# Patient Record
Sex: Female | Born: 1940
Health system: Southern US, Community
[De-identification: ages and names within clinical notes are randomized; demographics above are authoritative.]

## PROBLEM LIST (undated history)

## (undated) DIAGNOSIS — K219 Gastro-esophageal reflux disease without esophagitis: Secondary | ICD-10-CM

## (undated) DIAGNOSIS — I252 Old myocardial infarction: Secondary | ICD-10-CM

## (undated) DIAGNOSIS — E785 Hyperlipidemia, unspecified: Secondary | ICD-10-CM

## (undated) DIAGNOSIS — I10 Essential (primary) hypertension: Secondary | ICD-10-CM

## (undated) DIAGNOSIS — I2510 Atherosclerotic heart disease of native coronary artery without angina pectoris: Secondary | ICD-10-CM

## (undated) HISTORY — PX: BREAST EXCISIONAL BIOPSY: SUR124

## (undated) HISTORY — PX: ABDOMINAL HYSTERECTOMY: SHX81

---

## 1993-04-07 DIAGNOSIS — I252 Old myocardial infarction: Secondary | ICD-10-CM

## 1993-04-07 HISTORY — DX: Old myocardial infarction: I25.2

## 2003-11-28 ENCOUNTER — Other Ambulatory Visit: Payer: Self-pay

## 2004-01-24 ENCOUNTER — Ambulatory Visit: Payer: Self-pay | Admitting: Ophthalmology

## 2004-03-13 ENCOUNTER — Ambulatory Visit: Payer: Self-pay | Admitting: Ophthalmology

## 2004-04-15 ENCOUNTER — Emergency Department: Payer: Self-pay | Admitting: Emergency Medicine

## 2005-04-19 ENCOUNTER — Other Ambulatory Visit: Payer: Self-pay

## 2005-04-19 ENCOUNTER — Emergency Department: Payer: Self-pay | Admitting: Emergency Medicine

## 2005-07-21 ENCOUNTER — Emergency Department: Payer: Self-pay | Admitting: Emergency Medicine

## 2005-07-24 ENCOUNTER — Ambulatory Visit: Payer: Self-pay | Admitting: Internal Medicine

## 2005-11-09 ENCOUNTER — Emergency Department: Payer: Self-pay | Admitting: Emergency Medicine

## 2005-11-09 ENCOUNTER — Other Ambulatory Visit: Payer: Self-pay

## 2005-12-07 ENCOUNTER — Other Ambulatory Visit: Payer: Self-pay

## 2005-12-07 ENCOUNTER — Emergency Department: Payer: Self-pay | Admitting: Unknown Physician Specialty

## 2006-04-26 ENCOUNTER — Other Ambulatory Visit: Payer: Self-pay

## 2006-04-26 ENCOUNTER — Emergency Department: Payer: Self-pay | Admitting: Unknown Physician Specialty

## 2006-06-16 ENCOUNTER — Emergency Department: Payer: Self-pay | Admitting: Emergency Medicine

## 2006-08-25 ENCOUNTER — Ambulatory Visit: Payer: Self-pay | Admitting: Internal Medicine

## 2006-11-07 ENCOUNTER — Emergency Department: Payer: Self-pay

## 2007-01-14 ENCOUNTER — Ambulatory Visit: Payer: Self-pay | Admitting: Internal Medicine

## 2007-08-27 ENCOUNTER — Ambulatory Visit: Payer: Self-pay | Admitting: Internal Medicine

## 2008-08-29 ENCOUNTER — Ambulatory Visit: Payer: Self-pay | Admitting: Internal Medicine

## 2008-10-24 ENCOUNTER — Ambulatory Visit: Payer: Self-pay | Admitting: Internal Medicine

## 2009-01-06 ENCOUNTER — Emergency Department: Payer: Self-pay | Admitting: Emergency Medicine

## 2009-02-15 ENCOUNTER — Emergency Department: Payer: Self-pay | Admitting: Emergency Medicine

## 2009-04-20 ENCOUNTER — Ambulatory Visit: Payer: Self-pay | Admitting: General Practice

## 2009-09-05 ENCOUNTER — Ambulatory Visit: Payer: Self-pay | Admitting: Internal Medicine

## 2009-10-02 ENCOUNTER — Ambulatory Visit: Payer: Self-pay | Admitting: Internal Medicine

## 2009-10-05 ENCOUNTER — Ambulatory Visit: Payer: Self-pay | Admitting: Internal Medicine

## 2009-10-15 ENCOUNTER — Ambulatory Visit: Payer: Self-pay | Admitting: Internal Medicine

## 2009-11-05 ENCOUNTER — Ambulatory Visit: Payer: Self-pay | Admitting: Internal Medicine

## 2009-12-06 ENCOUNTER — Ambulatory Visit: Payer: Self-pay | Admitting: Internal Medicine

## 2010-01-08 ENCOUNTER — Ambulatory Visit: Payer: Self-pay | Admitting: Gastroenterology

## 2010-01-10 LAB — PATHOLOGY REPORT

## 2010-09-10 ENCOUNTER — Ambulatory Visit: Payer: Self-pay | Admitting: Otolaryngology

## 2011-05-02 ENCOUNTER — Ambulatory Visit: Payer: Self-pay | Admitting: Internal Medicine

## 2011-10-06 ENCOUNTER — Ambulatory Visit: Payer: Self-pay | Admitting: Cardiovascular Disease

## 2011-10-06 LAB — BASIC METABOLIC PANEL
Anion Gap: 5 — ABNORMAL LOW (ref 7–16)
BUN: 13 mg/dL (ref 7–18)
Calcium, Total: 8.8 mg/dL (ref 8.5–10.1)
Chloride: 104 mmol/L (ref 98–107)
EGFR (African American): >60
EGFR (Non-African Amer.): 54 — ABNORMAL LOW
Glucose: 105 mg/dL — ABNORMAL HIGH (ref 65–99)
Osmolality: 276 (ref 275–301)

## 2011-10-06 LAB — HEMOGLOBIN: HGB: 11.9 g/dL — ABNORMAL LOW (ref 12.0–16.0)

## 2011-10-09 ENCOUNTER — Emergency Department: Payer: Self-pay | Admitting: Emergency Medicine

## 2011-10-09 LAB — CBC WITH DIFFERENTIAL/PLATELET
Basophil #: 0 10*3/uL (ref 0.0–0.1)
Eosinophil %: 0.1 %
HCT: 46.5 % (ref 35.0–47.0)
MCH: 31.6 pg (ref 26.0–34.0)
Monocyte #: 0.5 x10 3/mm (ref 0.2–0.9)
Monocyte %: 4.9 %
Neutrophil #: 9.2 10*3/uL — ABNORMAL HIGH (ref 1.4–6.5)
Neutrophil %: 86.2 %
Platelet: 295 10*3/uL (ref 150–440)
RBC: 4.89 10*6/uL (ref 3.80–5.20)
WBC: 10.6 10*3/uL (ref 3.6–11.0)

## 2011-10-09 LAB — COMPREHENSIVE METABOLIC PANEL
Anion Gap: 8 (ref 7–16)
Calcium, Total: 9 mg/dL (ref 8.5–10.1)
Chloride: 101 mmol/L (ref 98–107)
EGFR (African American): >60
Glucose: 132 mg/dL — ABNORMAL HIGH (ref 65–99)
Osmolality: 271 (ref 275–301)
Potassium: 3.9 mmol/L (ref 3.5–5.1)
SGOT(AST): 22 U/L (ref 15–37)
SGPT (ALT): 14 U/L
Sodium: 134 mmol/L — ABNORMAL LOW (ref 136–145)

## 2011-10-09 LAB — URINALYSIS, COMPLETE
Bilirubin,UR: NEGATIVE
Glucose,UR: NEGATIVE mg/dL (ref 0–75)
Leukocyte Esterase: NEGATIVE
Nitrite: NEGATIVE
Protein: 100
RBC,UR: 1 /HPF (ref 0–5)
WBC UR: 4 /HPF (ref 0–5)

## 2011-10-09 LAB — TROPONIN I
Troponin-I: <0.02 ng/mL
Troponin-I: 0.03 ng/mL

## 2011-10-09 LAB — CK TOTAL AND CKMB (NOT AT ARMC): CK, Total: 82 U/L (ref 21–215)

## 2011-10-14 DIAGNOSIS — E785 Hyperlipidemia, unspecified: Secondary | ICD-10-CM | POA: Insufficient documentation

## 2011-10-14 DIAGNOSIS — I219 Acute myocardial infarction, unspecified: Secondary | ICD-10-CM | POA: Insufficient documentation

## 2011-10-14 DIAGNOSIS — I252 Old myocardial infarction: Secondary | ICD-10-CM | POA: Insufficient documentation

## 2011-10-14 DIAGNOSIS — E782 Mixed hyperlipidemia: Secondary | ICD-10-CM | POA: Insufficient documentation

## 2011-10-14 HISTORY — DX: Acute myocardial infarction, unspecified: I21.9

## 2011-10-15 HISTORY — PX: CORONARY ARTERY BYPASS GRAFT: SHX141

## 2011-11-13 ENCOUNTER — Encounter: Payer: Self-pay | Admitting: Internal Medicine

## 2011-12-07 ENCOUNTER — Encounter: Payer: Self-pay | Admitting: Internal Medicine

## 2012-01-06 ENCOUNTER — Encounter: Payer: Self-pay | Admitting: Internal Medicine

## 2014-04-14 ENCOUNTER — Ambulatory Visit: Payer: Self-pay | Admitting: Internal Medicine

## 2014-04-20 ENCOUNTER — Ambulatory Visit: Payer: Self-pay | Admitting: Internal Medicine

## 2015-12-16 ENCOUNTER — Encounter: Payer: Self-pay | Admitting: Emergency Medicine

## 2015-12-16 ENCOUNTER — Observation Stay
Admission: EM | Admit: 2015-12-16 | Discharge: 2015-12-17 | Disposition: A | Discharge status: Home / Self Care | Payer: Medicare Other | Attending: Internal Medicine | Admitting: Internal Medicine

## 2015-12-16 DIAGNOSIS — E785 Hyperlipidemia, unspecified: Secondary | ICD-10-CM | POA: Diagnosis not present

## 2015-12-16 DIAGNOSIS — I493 Ventricular premature depolarization: Secondary | ICD-10-CM | POA: Insufficient documentation

## 2015-12-16 DIAGNOSIS — N179 Acute kidney failure, unspecified: Secondary | ICD-10-CM | POA: Diagnosis present

## 2015-12-16 DIAGNOSIS — Z8249 Family history of ischemic heart disease and other diseases of the circulatory system: Secondary | ICD-10-CM | POA: Insufficient documentation

## 2015-12-16 DIAGNOSIS — K219 Gastro-esophageal reflux disease without esophagitis: Secondary | ICD-10-CM | POA: Diagnosis present

## 2015-12-16 DIAGNOSIS — F329 Major depressive disorder, single episode, unspecified: Secondary | ICD-10-CM | POA: Insufficient documentation

## 2015-12-16 DIAGNOSIS — I2510 Atherosclerotic heart disease of native coronary artery without angina pectoris: Secondary | ICD-10-CM | POA: Diagnosis present

## 2015-12-16 DIAGNOSIS — K449 Diaphragmatic hernia without obstruction or gangrene: Secondary | ICD-10-CM | POA: Diagnosis not present

## 2015-12-16 DIAGNOSIS — R112 Nausea with vomiting, unspecified: Secondary | ICD-10-CM | POA: Diagnosis present

## 2015-12-16 DIAGNOSIS — Z8042 Family history of malignant neoplasm of prostate: Secondary | ICD-10-CM | POA: Insufficient documentation

## 2015-12-16 DIAGNOSIS — I10 Essential (primary) hypertension: Secondary | ICD-10-CM | POA: Diagnosis not present

## 2015-12-16 DIAGNOSIS — Z90710 Acquired absence of both cervix and uterus: Secondary | ICD-10-CM | POA: Diagnosis not present

## 2015-12-16 DIAGNOSIS — E860 Dehydration: Principal | ICD-10-CM

## 2015-12-16 DIAGNOSIS — K529 Noninfective gastroenteritis and colitis, unspecified: Secondary | ICD-10-CM | POA: Insufficient documentation

## 2015-12-16 DIAGNOSIS — R531 Weakness: Secondary | ICD-10-CM

## 2015-12-16 HISTORY — DX: Hyperlipidemia, unspecified: E78.5

## 2015-12-16 HISTORY — DX: Atherosclerotic heart disease of native coronary artery without angina pectoris: I25.10

## 2015-12-16 HISTORY — DX: Essential (primary) hypertension: I10

## 2015-12-16 HISTORY — DX: Gastro-esophageal reflux disease without esophagitis: K21.9

## 2015-12-16 LAB — CBC
HCT: 45.1 % (ref 35.0–47.0)
Hemoglobin: 16 g/dL (ref 12.0–16.0)
MCH: 32.7 pg (ref 26.0–34.0)
MCHC: 35.4 g/dL (ref 32.0–36.0)
MCV: 92.4 fL (ref 80.0–100.0)
Platelets: 301 10*3/uL (ref 150–440)
RBC: 4.88 MIL/uL (ref 3.80–5.20)
RDW: 13.4 % (ref 11.5–14.5)
WBC: 12.5 10*3/uL — ABNORMAL HIGH (ref 3.6–11.0)

## 2015-12-16 LAB — URINALYSIS COMPLETE WITH MICROSCOPIC (ARMC ONLY)
BILIRUBIN URINE: NEGATIVE
Glucose, UA: NEGATIVE mg/dL
Hgb urine dipstick: NEGATIVE
Ketones, ur: NEGATIVE mg/dL
Leukocytes, UA: NEGATIVE
NITRITE: NEGATIVE
Protein, ur: 100 mg/dL — AB
Specific Gravity, Urine: 1.019 (ref 1.005–1.030)
pH: 5 (ref 5.0–8.0)

## 2015-12-16 LAB — COMPREHENSIVE METABOLIC PANEL
ALK PHOS: 75 U/L (ref 38–126)
ALT: 11 U/L — AB (ref 14–54)
ANION GAP: 13 (ref 5–15)
AST: 27 U/L (ref 15–41)
Albumin: 3.8 g/dL (ref 3.5–5.0)
BILIRUBIN TOTAL: 0.4 mg/dL (ref 0.3–1.2)
BUN: 25 mg/dL — ABNORMAL HIGH (ref 6–20)
CALCIUM: 9 mg/dL (ref 8.9–10.3)
CO2: 22 mmol/L (ref 22–32)
CREATININE: 2.48 mg/dL — AB (ref 0.44–1.00)
Chloride: 99 mmol/L — ABNORMAL LOW (ref 101–111)
GFR calc non Af Amer: 18 mL/min — ABNORMAL LOW (ref >60)
GFR, EST AFRICAN AMERICAN: 21 mL/min — AB (ref >60)
Glucose, Bld: 164 mg/dL — ABNORMAL HIGH (ref 65–99)
Potassium: 3.7 mmol/L (ref 3.5–5.1)
SODIUM: 134 mmol/L — AB (ref 135–145)
TOTAL PROTEIN: 8.6 g/dL — AB (ref 6.5–8.1)

## 2015-12-16 LAB — TROPONIN I: Troponin I: 0.03 ng/mL (ref <0.03)

## 2015-12-16 LAB — LIPASE, BLOOD: Lipase: 30 U/L (ref 11–51)

## 2015-12-16 MED ORDER — ONDANSETRON HCL 4 MG PO TABS: 4.0000 mg | ORAL_TABLET | Freq: Four times a day (QID) | ORAL | Status: DC | PRN

## 2015-12-16 MED ORDER — SODIUM CHLORIDE 0.9 % IV SOLN
INTRAVENOUS | Status: AC
  Administered 2015-12-16 – 2015-12-17 (×2): via INTRAVENOUS

## 2015-12-16 MED ORDER — PANTOPRAZOLE SODIUM 40 MG PO TBEC
40.0000 mg | DELAYED_RELEASE_TABLET | Freq: Every day | ORAL | Status: DC
  Administered 2015-12-17: 40 mg via ORAL
  Filled 2015-12-16: qty 1

## 2015-12-16 MED ORDER — SODIUM CHLORIDE 0.9 % IV BOLUS (SEPSIS)
1000.0000 mL | Freq: Once | INTRAVENOUS | Status: AC
Start: 2015-12-16 — End: 2015-12-16
  Administered 2015-12-16: 1000 mL via INTRAVENOUS

## 2015-12-16 MED ORDER — ACETAMINOPHEN 325 MG PO TABS
650.0000 mg | ORAL_TABLET | Freq: Four times a day (QID) | ORAL | Status: DC | PRN
Start: 2015-12-16 — End: 2015-12-17

## 2015-12-16 MED ORDER — ALPRAZOLAM 0.25 MG PO TABS
0.2500 mg | ORAL_TABLET | Freq: Every day | ORAL | Status: DC
  Administered 2015-12-16: 0.25 mg via ORAL
  Filled 2015-12-16: qty 1

## 2015-12-16 MED ORDER — PRAVASTATIN SODIUM 20 MG PO TABS: 20.0000 mg | ORAL_TABLET | Freq: Every evening | ORAL | Status: DC

## 2015-12-16 MED ORDER — HEPARIN SODIUM (PORCINE) 5000 UNIT/ML IJ SOLN
5000.0000 [IU] | Freq: Three times a day (TID) | INTRAMUSCULAR | Status: DC
  Administered 2015-12-17 (×2): 5000 [IU] via SUBCUTANEOUS
  Filled 2015-12-16 (×2): qty 1

## 2015-12-16 MED ORDER — ONDANSETRON HCL 4 MG/2ML IJ SOLN: 4.0000 mg | Freq: Four times a day (QID) | INTRAMUSCULAR | Status: DC | PRN

## 2015-12-16 MED ORDER — SODIUM CHLORIDE 0.9% FLUSH: 3.0000 mL | Freq: Two times a day (BID) | INTRAVENOUS | Status: DC

## 2015-12-16 MED ORDER — ACETAMINOPHEN 650 MG RE SUPP
650.0000 mg | Freq: Four times a day (QID) | RECTAL | Status: DC | PRN
Start: 2015-12-16 — End: 2015-12-17

## 2015-12-16 MED ORDER — SODIUM CHLORIDE 0.9 % IV BOLUS (SEPSIS)
1000.0000 mL | Freq: Once | INTRAVENOUS | Status: AC
  Administered 2015-12-16: 1000 mL via INTRAVENOUS

## 2015-12-16 NOTE — ED Provider Notes (Signed)
[facility] Emergency Department Provider Note  Time seen: 7:54 PM  I have reviewed the triage vital signs and the nursing notes.   HISTORY  Chief Complaint Emesis    HPI Natasha Murray is a 75 y.o. female with a past medical history of hypertension who presents the emergency department for nausea and vomiting and diarrhea. According to the patient around 11:00 last night she began feeling very nauseated, has had many rounds of diarrhea which she states is approximately 10 episodes total. Has had multiple episodes of vomiting. Denies any abdominal pain, just states she is a little sore from all the vomiting. Denies any chest pain or shortness of breath. Denies any fever or dysuria. Patient states she continues to feel nauseated, and recently had a loose bowel movement. Denies any black or bloody stool or vomit.  Past Medical History:  Diagnosis Date  . Hypertension     There are no active problems to display for this patient.   Past Surgical History:  Procedure Laterality Date  . ABDOMINAL HYSTERECTOMY    . CARDIAC SURGERY      Prior to Admission medications   Not on File    No Known Allergies  History reviewed. No pertinent family history.  Social History Social History  Substance Use Topics  . Smoking status: Never Smoker  . Smokeless tobacco: Never Used  . Alcohol use No    Review of Systems Constitutional: Negative for fever. Cardiovascular: Negative for chest pain. Respiratory: Negative for shortness of breath. Gastrointestinal: Negative for abdominal pain. Positive for nausea, vomiting, diarrhea. Musculoskeletal: Negative for back pain. Neurological: Negative for headache 10-point ROS otherwise negative.  ____________________________________________   PHYSICAL EXAM:  VITAL SIGNS: ED Triage Vitals  Enc Vitals Group     BP 12/16/15 1758 (!) 87/52     Pulse Rate 12/16/15 1758 100     Resp 12/16/15 1758 20     Temp 12/16/15  1756 98.2 F (36.8 C)     Temp Source 12/16/15 1756 Oral     SpO2 12/16/15 1758 99 %     Weight 12/16/15 1753 159 lb (72.1 kg)     Height 12/16/15 1753 5\' 4"  (1.626 m)     Head Circumference --      Peak Flow --      Pain Score 12/16/15 1753 8     Pain Loc --      Pain Edu? --      Excl. in [place]? --     Constitutional: Alert and oriented. Well appearing and in no distress. Eyes: Normal exam ENT   Head: Normocephalic and atraumatic   Mouth/Throat: Mucous membranes are moist. Cardiovascular: Normal rate, regular rhythm. No murmur Respiratory: Normal respiratory effort without tachypnea nor retractions. Breath sounds are clear  Gastrointestinal: Soft and nontender. No distention.   Musculoskeletal: Nontender with normal range of motion in all extremities. Neurologic:  Normal speech and language. No gross focal neurologic deficits Skin:  Skin is warm, dry and intact.  Psychiatric: Mood and affect are normal.   ____________________________________________    EKG  EKG reviewed and interpreted by myself shows normal sinus rhythm at 95 bpm, narrow QRS, normal axis patient does have inferolateral ST changes. However largely unchanged 10/09/11.  ____________________________________________    INITIAL IMPRESSION / ASSESSMENT AND PLAN / ED COURSE  Pertinent labs & imaging results that were available during my care of the patient were reviewed by me and considered in my medical decision making (see chart  for details).  Patient presents the emergency department nausea, vomiting, diarrhea beginning at 11:00 last night. Patient's blood pressure is in the upper [id number] systolic. Patient labs are resulted showing an elevated creatinine consistent with dehydration. We'll continue with IV fluids. Patient does have EKG abnormalities, other largely unchanged from prior EKG in 2013. Patient's troponin is 0.03.  We will send a stool culture/file fire along with C. difficile testing. Patient denies  any recent antibiotics. We'll place on enteric precautions, continue with IV fluids and plan to admit to the hospital for further treatment. As the patient has a nontender abdominal exam do not believe CT imaging would be of much utility at this point.  ____________________________________________   FINAL CLINICAL IMPRESSION(S) / ED DIAGNOSES  Acute renal insufficiency Dehydration Hypotension    Harvest Dark, MD 12/16/15 2218

## 2015-12-16 NOTE — ED Triage Notes (Signed)
Pt reports NVD since last night. Denies blood in stool or vomit. C/o LLQ and lower mid abdominal pain. Denies urinary sx. Subjective fever

## 2015-12-16 NOTE — H&P (Signed)
[place] [facility] [place] NAME: Natasha Murray    MR#:  [id number]  DATE OF BIRTH:  1941/02/01  DATE OF ADMISSION:  12/16/2015  PRIMARY CARE PHYSICIAN: Pcp Not In System   REQUESTING/REFERRING PHYSICIAN: Kerman Passey, MD  CHIEF COMPLAINT:   Chief Complaint  Patient presents with  . Emesis    HISTORY OF PRESENT ILLNESS:  Natasha Murray  is a 75 y.o. female who presents with 24 hours of intractable nausea and vomiting with diarrhea. Patient states that she's not had any recent sick contacts, she denies any fevers or chills, denies any recent antibiotic use. States she's never had C. difficile infection in the past. States that she has had episodes similar to this one, but the last one was years ago. At that time she was told that she had a hiatal hernia and that vomiting episodes were related to that. She states that she has not had any episodes like this since her CABG procedure 6-7 years ago. She states that initially she had some abdominal pain followed by the diarrhea, and subsequent to that she had profuse vomiting about every hour, accompanied by mild diarrhea each time she vomited. In the ED she was found to be dehydrated, initially hypotensive on presentation, and had significant AK I. Hospitalists were called for admission  PAST MEDICAL HISTORY:   Past Medical History:  Diagnosis Date  . CAD (coronary artery disease)   . GERD (gastroesophageal reflux disease)   . HLD (hyperlipidemia)   . Hypertension     PAST SURGICAL HISTORY:   Past Surgical History:  Procedure Laterality Date  . ABDOMINAL HYSTERECTOMY    . CARDIAC SURGERY      SOCIAL HISTORY:   Social History  Substance Use Topics  . Smoking status: Never Smoker  . Smokeless tobacco: Never Used  . Alcohol use No    FAMILY HISTORY:   Family History  Problem Relation Age of Onset  . Peripheral vascular disease Father   . Prostate cancer Brother   . CAD Mother    . Hypertension Mother     DRUG ALLERGIES:  No Known Allergies  MEDICATIONS AT HOME:   Prior to Admission medications   Not on File    REVIEW OF SYSTEMS:  Review of Systems  Constitutional: Negative for chills, fever, malaise/fatigue and weight loss.  HENT: Negative for ear pain, hearing loss and tinnitus.   Eyes: Negative for blurred vision, double vision, pain and redness.  Respiratory: Negative for cough, hemoptysis and shortness of breath.   Cardiovascular: Negative for chest pain, palpitations, orthopnea and leg swelling.  Gastrointestinal: Positive for abdominal pain, diarrhea, nausea and vomiting. Negative for constipation.  Genitourinary: Negative for dysuria, frequency and hematuria.  Musculoskeletal: Negative for back pain, joint pain and neck pain.  Skin:       No acne, rash, or lesions  Neurological: Positive for weakness. Negative for dizziness, tremors and focal weakness.  Endo/Heme/Allergies: Negative for polydipsia. Does not bruise/bleed easily.  Psychiatric/Behavioral: Negative for depression. The patient is not nervous/anxious and does not have insomnia.      VITAL SIGNS:   Vitals:   12/16/15 1753 12/16/15 1756 12/16/15 1758 12/16/15 1759  BP:   (!) 87/52 (!) 84/55  Pulse:   100   Resp:   20   Temp:  98.2 F (36.8 C) 98.2 F (36.8 C)   TempSrc:  Oral Oral   SpO2:   99%   Weight: 72.1 kg (159 lb)  Height: 5\' 4"  (1.626 m)      Wt Readings from Last 3 Encounters:  12/16/15 72.1 kg (159 lb)    PHYSICAL EXAMINATION:  Physical Exam  Vitals reviewed. Constitutional: She is oriented to person, place, and time. She appears well-developed and well-nourished. No distress.  HENT:  Head: Normocephalic and atraumatic.  Dry mucous membranes  Eyes: Conjunctivae and EOM are normal. Pupils are equal, round, and reactive to light. No scleral icterus.  Neck: Normal range of motion. Neck supple. No JVD present. No thyromegaly present.  Cardiovascular: Normal  rate, regular rhythm and intact distal pulses.  Exam reveals no gallop and no friction rub.   No murmur heard. Respiratory: Effort normal and breath sounds normal. No respiratory distress. She has no wheezes. She has no rales.  GI: Soft. Bowel sounds are normal. She exhibits no distension. There is tenderness (diffuse, worst in the periumbilical to suprapubic region).  Musculoskeletal: Normal range of motion. She exhibits no edema.  No arthritis, no gout  Lymphadenopathy:    She has no cervical adenopathy.  Neurological: She is alert and oriented to person, place, and time. No cranial nerve deficit.  No dysarthria, no aphasia  Skin: Skin is warm and dry. No rash noted. No erythema.  Psychiatric: She has a normal mood and affect. Her behavior is normal. Judgment and thought content normal.    LABORATORY PANEL:   CBC  Recent Labs Lab 12/16/15 1756  WBC 12.5*  HGB 16.0  HCT 45.1  PLT 301   ------------------------------------------------------------------------------------------------------------------  Chemistries   Recent Labs Lab 12/16/15 1756  NA 134*  K 3.7  CL 99*  CO2 22  GLUCOSE 164*  BUN 25*  CREATININE 2.48*  CALCIUM 9.0  AST 27  ALT 11*  ALKPHOS 75  BILITOT 0.4   ------------------------------------------------------------------------------------------------------------------  Cardiac Enzymes  Recent Labs Lab 12/16/15 1756  TROPONINI 0.03*   ------------------------------------------------------------------------------------------------------------------  RADIOLOGY:  No results found.  EKG:   Orders placed or performed during the hospital encounter of 12/16/15  . ED EKG within 10 minutes  . ED EKG within 10 minutes  . EKG 12-Lead  . EKG 12-Lead    IMPRESSION AND PLAN:  Principal Problem:   AKI (acute kidney injury) ([place]) - given her profuse diarrhea and vomiting this is almost certainly old prerenal insult. We will hydrate her with IV  fluids tonight and monitor for expected improvement in her creatinine. Avoid nephrotoxins. Active Problems:   Intractable nausea and vomiting - unclear etiology at this time. GI panel and C. difficile sent from the ED, we'll follow up on these labs. Patient's vomiting is currently controlled with medications given in the ED, we will keep these when necessary antiemetics on board.   HTN (hypertension) - hold any home antihypertensives at this time given that her blood pressure was initially hypotensive on presentation the ED.   CAD (coronary artery disease) - continue home meds   Weakness - generalized and nonfocal, due to her dehydration and significant nausea vomiting diarrhea, expect improvement with hydration and treatment as above   GERD (gastroesophageal reflux disease) - continue home meds  All the records are reviewed and case discussed with ED provider. Management plans discussed with the patient and/or family.  DVT PROPHYLAXIS: SubQ heparin  GI PROPHYLAXIS: PPI, H2 blocker, None  ADMISSION STATUS: Observation  CODE STATUS: Full Code Status History    This patient does not have a recorded code status. Please follow your organizational policy for patients in this situation.  TOTAL TIME TAKING CARE OF THIS PATIENT: 40 minutes.    ,  [place] 12/16/2015, 9:32 PM  [facility]  Office  [phone]  CC: Primary care physician; Pcp Not In System

## 2015-12-16 NOTE — Care Management Obs Status (Signed)
[facility] NOTIFICATION   Patient Details  Name: Natasha Murray MRN: [id number] Date of Birth: 28-Mar-1941   Medicare Observation Status Notification Given:  Yes    CrutchfieldAntony Haste, RN 12/16/2015, 9:47 PM

## 2015-12-16 NOTE — ED Notes (Signed)
Notified MD of critical troponin.

## 2015-12-17 LAB — GASTROINTESTINAL PANEL BY PCR, STOOL (REPLACES STOOL CULTURE)

## 2015-12-17 LAB — CBC
HCT: 35.7 % (ref 35.0–47.0)
Hemoglobin: 12.8 g/dL (ref 12.0–16.0)
MCH: 33.4 pg (ref 26.0–34.0)
MCHC: 36 g/dL (ref 32.0–36.0)
MCV: 92.7 fL (ref 80.0–100.0)
PLATELETS: 222 10*3/uL (ref 150–440)
RBC: 3.85 MIL/uL (ref 3.80–5.20)
RDW: 13.7 % (ref 11.5–14.5)
WBC: 8.8 10*3/uL (ref 3.6–11.0)

## 2015-12-17 LAB — C DIFFICILE QUICK SCREEN W PCR REFLEX
C DIFFICILE (CDIFF) INTERP: NOT DETECTED
C DIFFICLE (CDIFF) ANTIGEN: NEGATIVE
C Diff toxin: NEGATIVE

## 2015-12-17 LAB — BASIC METABOLIC PANEL
ANION GAP: 7 (ref 5–15)
BUN: 25 mg/dL — ABNORMAL HIGH (ref 6–20)
CALCIUM: 7.9 mg/dL — AB (ref 8.9–10.3)
CHLORIDE: 107 mmol/L (ref 101–111)
CO2: 24 mmol/L (ref 22–32)
CREATININE: 1.5 mg/dL — AB (ref 0.44–1.00)
GFR calc non Af Amer: 33 mL/min — ABNORMAL LOW (ref >60)
GFR, EST AFRICAN AMERICAN: 38 mL/min — AB (ref >60)
Glucose, Bld: 108 mg/dL — ABNORMAL HIGH (ref 65–99)
Potassium: 3.4 mmol/L — ABNORMAL LOW (ref 3.5–5.1)
SODIUM: 138 mmol/L (ref 135–145)

## 2015-12-17 LAB — TROPONIN I
Troponin I: 0.03 ng/mL (ref <0.03)
Troponin I: <0.03 ng/mL (ref <0.03)

## 2015-12-17 MED ORDER — POTASSIUM CHLORIDE CRYS ER 20 MEQ PO TBCR
40.0000 meq | EXTENDED_RELEASE_TABLET | Freq: Once | ORAL | Status: AC
  Administered 2015-12-17: 40 meq via ORAL
  Filled 2015-12-17: qty 2

## 2015-12-17 NOTE — Progress Notes (Signed)
12/17/2015 4:39 PM  Cristen A Rasberry to be D/C'd Home per MD order.  Discussed prescriptions and follow up appointments with the patient. Prescriptions given to patient, medication list explained in detail. Pt verbalized understanding.    Medication List    STOP taking these medications   hydrochlorothiazide 12.5 MG capsule Commonly known as:  MICROZIDE     TAKE these medications   ALPRAZolam 0.25 MG tablet Commonly known as:  XANAX Take 0.25 mg by mouth at bedtime.   amLODipine-benazepril 10-40 MG capsule Commonly known as:  LOTREL Take 1 capsule by mouth daily.   pravastatin 20 MG tablet Commonly known as:  PRAVACHOL Take 20 mg by mouth every evening.   RABEprazole 20 MG tablet Commonly known as:  ACIPHEX Take 20 mg by mouth daily.       Vitals:   12/17/15 0910 12/17/15 1414  BP: 140/60 136/69  Pulse: 80 81  Resp: 20 16  Temp: 98.7 F (37.1 C) 98 F (36.7 C)    Skin clean, dry and intact without evidence of skin break down, no evidence of skin tears noted. IV catheter discontinued intact. Site without signs and symptoms of complications. Dressing and pressure applied. Pt denies pain at this time. No complaints noted.  An After Visit Summary was printed and given to the patient. Patient escorted via [place], and D/C home via private auto.  Dola Argyle

## 2015-12-17 NOTE — Discharge Instructions (Signed)

## 2015-12-17 NOTE — Discharge Summary (Signed)
Natasha Murray, 75 y.o., DOB 1940/11/12, MRN [id number]. Admission date: 12/16/2015 Discharge Date 12/17/2015 Primary MD Pcp Not In System Admitting Physician Lance Coon, MD  Admission Diagnosis  Dehydration [E86.0] Non-intractable vomiting with nausea, vomiting of unspecified type [R11.2]  Discharge Diagnosis   Principal Problem:   AKI (acute kidney injury) ([place]) due to tree dehydration   Intractable nausea and vomiting due to acute gastroenteritis   Weakness   HTN (hypertension)   GERD (gastroesophageal reflux disease)   CAD (coronary artery disease)         Natasha Murray  is a 75 y.o. female who presents with 24 hours of intractable nausea and vomiting with diarrhea. Patient was noted to have elevated creatinine with acute renal failure due to dehydration. She was admitted for further evaluation. Stool studies were negative for C. difficile . Stool PCR was negative as well. Patient's symptoms have now resolved is doing much better her creatinine is improved. At this point I recommended patient continue oral hydration at home. We'll stop her HCTZ until seen by primary care provider she'll need a follow-up BMP at that time. But she is doing much better if she tolerates her lunch she'll be discharged later today.           Consults  None  Significant Tests:  See full reports for all details    No results found.     Today   Subjective:   Natasha Murray patient feeling much better diarrhea is resolved nausea is resolved she wants to eat  Objective:   Blood pressure 136/69, pulse 81, temperature 98 F (36.7 C), temperature source Oral, resp. rate 16, height 4\' 10"  (1.473 m), weight 162 lb 8 oz (73.7 kg), SpO2 99 %.  .  Intake/Output Summary (Last 24 hours) at 12/17/15 1420 Last data filed at 12/17/15 1300  Gross per 24 hour  Intake              120 ml  Output              250 ml  Net             -130 ml    Exam VITAL SIGNS: Blood pressure  136/69, pulse 81, temperature 98 F (36.7 C), temperature source Oral, resp. rate 16, height 4\' 10"  (1.473 m), weight 162 lb 8 oz (73.7 kg), SpO2 99 %.  GENERAL:  74 y.o.-year-old patient lying in the bed with no acute distress.  EYES: Pupils equal, round, reactive to light and accommodation. No scleral icterus. Extraocular muscles intact.  HEENT: Head atraumatic, normocephalic. Oropharynx and nasopharynx clear.  NECK:  Supple, no jugular venous distention. No thyroid enlargement, no tenderness.  LUNGS: Normal breath sounds bilaterally, no wheezing, rales,rhonchi or crepitation. No use of accessory muscles of respiration.  CARDIOVASCULAR: S1, S2 normal. No murmurs, rubs, or gallops.  ABDOMEN: Soft, nontender, nondistended. Bowel sounds present. No organomegaly or mass.  EXTREMITIES: No pedal edema, cyanosis, or clubbing.  NEUROLOGIC: Cranial nerves II through XII are intact. Muscle strength 5/5 in all extremities. Sensation intact. Gait not checked.  PSYCHIATRIC: The patient is alert and oriented x 3.  SKIN: No obvious rash, lesion, or ulcer.   Data Review     CBC w Diff: Lab Results  Component Value Date   WBC 8.8 12/17/2015   HGB 12.8 12/17/2015   HGB 15.5 10/09/2011   HCT 35.7 12/17/2015   HCT 46.5 10/09/2011   PLT 222 12/17/2015   PLT 295 10/09/2011  LYMPHOPCT 8.4 10/09/2011   MONOPCT 4.9 10/09/2011   EOSPCT 0.1 10/09/2011   BASOPCT 0.4 10/09/2011   CMP: Lab Results  Component Value Date   NA 138 12/17/2015   NA 134 (L) 10/09/2011   K 3.4 (L) 12/17/2015   K 3.9 10/09/2011   CL 107 12/17/2015   CL 101 10/09/2011   CO2 24 12/17/2015   CO2 25 10/09/2011   BUN 25 (H) 12/17/2015   BUN 14 10/09/2011   CREATININE 1.50 (H) 12/17/2015   CREATININE 1.05 10/09/2011   PROT 8.6 (H) 12/16/2015   PROT 9.0 (H) 10/09/2011   ALBUMIN 3.8 12/16/2015   ALBUMIN 3.6 10/09/2011   BILITOT 0.4 12/16/2015   BILITOT 0.4 10/09/2011   ALKPHOS 75 12/16/2015   ALKPHOS 104 10/09/2011   AST  27 12/16/2015   AST 22 10/09/2011   ALT 11 (L) 12/16/2015   ALT 14 10/09/2011  .  Micro Results Recent Results (from the past 240 hour(s))  C difficile quick scan w PCR reflex     Status: None   Collection Time: 12/16/15 11:15 PM  Result Value Ref Range Status   C Diff antigen NEGATIVE NEGATIVE Final   C Diff toxin NEGATIVE NEGATIVE Final   C Diff interpretation No C. difficile detected.  Final  Gastrointestinal Panel by PCR , Stool     Status: None   Collection Time: 12/16/15 11:15 PM  Result Value Ref Range Status   Campylobacter species NOT DETECTED NOT DETECTED Final   Plesimonas shigelloides NOT DETECTED NOT DETECTED Final   Salmonella species NOT DETECTED NOT DETECTED Final   Yersinia enterocolitica NOT DETECTED NOT DETECTED Final   Vibrio species NOT DETECTED NOT DETECTED Final   Vibrio cholerae NOT DETECTED NOT DETECTED Final   Enteroaggregative E coli (EAEC) NOT DETECTED NOT DETECTED Final   Enteropathogenic E coli (EPEC) NOT DETECTED NOT DETECTED Final   Enterotoxigenic E coli (ETEC) NOT DETECTED NOT DETECTED Final   Shiga like toxin producing E coli (STEC) NOT DETECTED NOT DETECTED Final   E. coli O157 NOT DETECTED NOT DETECTED Final   Shigella/Enteroinvasive E coli (EIEC) NOT DETECTED NOT DETECTED Final   Cryptosporidium NOT DETECTED NOT DETECTED Final   Cyclospora cayetanensis NOT DETECTED NOT DETECTED Final   Entamoeba histolytica NOT DETECTED NOT DETECTED Final   Giardia lamblia NOT DETECTED NOT DETECTED Final   Adenovirus F40/41 NOT DETECTED NOT DETECTED Final   Astrovirus NOT DETECTED NOT DETECTED Final   Norovirus GI/GII NOT DETECTED NOT DETECTED Final   Rotavirus A NOT DETECTED NOT DETECTED Final   Sapovirus (I, II, IV, and V) NOT DETECTED NOT DETECTED Final        Code Status Orders        Start     Ordered   12/16/15 2309  Full code  Continuous     12/16/15 2308    Code Status History    Date Active Date Inactive Code Status Order ID Comments  User Context   This patient has a current code status but no historical code status.          Follow-up Information    MASOUD,JAVED, MD On 12/24/2015.   Specialty:  Internal Medicine Why:  Monday at 10:00am for hospital follow-up Contact information: [place] [place] [address] [phone]           Discharge Medications     Medication List    STOP taking these medications   hydrochlorothiazide 12.5 MG capsule Commonly known as:  MICROZIDE     TAKE these medications   ALPRAZolam 0.25 MG tablet Commonly known as:  XANAX Take 0.25 mg by mouth at bedtime.   amLODipine-benazepril 10-40 MG capsule Commonly known as:  LOTREL Take 1 capsule by mouth daily.   pravastatin 20 MG tablet Commonly known as:  PRAVACHOL Take 20 mg by mouth every evening.   RABEprazole 20 MG tablet Commonly known as:  ACIPHEX Take 20 mg by mouth daily.          Total Time in preparing paper work, data evaluation and todays exam - 35 minutes  Dustin Flock M.D on 12/17/2015 at 2:20 PM  [facility]   Office  [phone]

## 2015-12-17 NOTE — Care Management (Signed)
Patient admitted from home with AKI as a result of diarrhea and vomiting.  Patient lives at home alone.  Nursing reports that patient is mobile with standby assist.  RNCM available for any discharge needs.

## 2016-01-06 ENCOUNTER — Emergency Department
Admission: EM | Admit: 2016-01-06 | Discharge: 2016-01-06 | Disposition: A | Discharge status: Home / Self Care | Payer: Medicare Other | Attending: Student in an Organized Health Care Education/Training Program | Admitting: Student in an Organized Health Care Education/Training Program

## 2016-01-06 ENCOUNTER — Emergency Department: Payer: Medicare Other

## 2016-01-06 ENCOUNTER — Encounter: Payer: Self-pay | Admitting: Emergency Medicine

## 2016-01-06 DIAGNOSIS — R1013 Epigastric pain: Secondary | ICD-10-CM | POA: Diagnosis not present

## 2016-01-06 DIAGNOSIS — I2510 Atherosclerotic heart disease of native coronary artery without angina pectoris: Secondary | ICD-10-CM | POA: Diagnosis not present

## 2016-01-06 DIAGNOSIS — R112 Nausea with vomiting, unspecified: Secondary | ICD-10-CM | POA: Insufficient documentation

## 2016-01-06 DIAGNOSIS — I10 Essential (primary) hypertension: Secondary | ICD-10-CM | POA: Insufficient documentation

## 2016-01-06 DIAGNOSIS — I252 Old myocardial infarction: Secondary | ICD-10-CM | POA: Diagnosis not present

## 2016-01-06 HISTORY — DX: Old myocardial infarction: I25.2

## 2016-01-06 LAB — COMPREHENSIVE METABOLIC PANEL
ALBUMIN: 3.8 g/dL (ref 3.5–5.0)
ALK PHOS: 84 U/L (ref 38–126)
ALT: 12 U/L — ABNORMAL LOW (ref 14–54)
ANION GAP: 7 (ref 5–15)
AST: 21 U/L (ref 15–41)
BUN: 14 mg/dL (ref 6–20)
CHLORIDE: 101 mmol/L (ref 101–111)
CO2: 27 mmol/L (ref 22–32)
Calcium: 9 mg/dL (ref 8.9–10.3)
Creatinine, Ser: 1.01 mg/dL — ABNORMAL HIGH (ref 0.44–1.00)
GFR calc non Af Amer: 53 mL/min — ABNORMAL LOW (ref >60)
GLUCOSE: 135 mg/dL — AB (ref 65–99)
POTASSIUM: 3.6 mmol/L (ref 3.5–5.1)
SODIUM: 135 mmol/L (ref 135–145)
Total Bilirubin: 0.8 mg/dL (ref 0.3–1.2)
Total Protein: 8.7 g/dL — ABNORMAL HIGH (ref 6.5–8.1)

## 2016-01-06 LAB — LIPASE, BLOOD: LIPASE: 26 U/L (ref 11–51)

## 2016-01-06 LAB — URINALYSIS COMPLETE WITH MICROSCOPIC (ARMC ONLY)
BACTERIA UA: NONE SEEN
BILIRUBIN URINE: NEGATIVE
Glucose, UA: NEGATIVE mg/dL
HGB URINE DIPSTICK: NEGATIVE
Ketones, ur: NEGATIVE mg/dL
Leukocytes, UA: NEGATIVE
Nitrite: NEGATIVE
PH: 5 (ref 5.0–8.0)
PROTEIN: 100 mg/dL — AB
Specific Gravity, Urine: 1.016 (ref 1.005–1.030)

## 2016-01-06 LAB — TROPONIN I
Troponin I: <0.03 ng/mL (ref <0.03)
Troponin I: <0.03 ng/mL (ref <0.03)

## 2016-01-06 LAB — CBC
HEMATOCRIT: 45.5 % (ref 35.0–47.0)
HEMOGLOBIN: 15.7 g/dL (ref 12.0–16.0)
MCH: 31.9 pg (ref 26.0–34.0)
MCHC: 34.4 g/dL (ref 32.0–36.0)
MCV: 92.8 fL (ref 80.0–100.0)
Platelets: 277 10*3/uL (ref 150–440)
RBC: 4.9 MIL/uL (ref 3.80–5.20)
RDW: 13.3 % (ref 11.5–14.5)
WBC: 8.2 10*3/uL (ref 3.6–11.0)

## 2016-01-06 MED ORDER — PROMETHAZINE HCL 25 MG/ML IJ SOLN
12.5000 mg | Freq: Once | INTRAMUSCULAR | Status: DC
  Filled 2016-01-06: qty 1

## 2016-01-06 MED ORDER — METOCLOPRAMIDE HCL 5 MG/ML IJ SOLN: 10.0000 mg | Freq: Once | INTRAMUSCULAR | Status: DC

## 2016-01-06 MED ORDER — GI COCKTAIL ~~LOC~~
30.0000 mL | Freq: Once | ORAL | Status: AC
  Administered 2016-01-06: 30 mL via ORAL
  Filled 2016-01-06: qty 30

## 2016-01-06 MED ORDER — PROMETHAZINE HCL 12.5 MG PO TABS: 12.5000 mg | ORAL_TABLET | Freq: Four times a day (QID) | ORAL | 0 refills | Status: DC | PRN

## 2016-01-06 MED ORDER — SODIUM CHLORIDE 0.9 % IV BOLUS (SEPSIS)
1000.0000 mL | Freq: Once | INTRAVENOUS | Status: AC
  Administered 2016-01-06: 1000 mL via INTRAVENOUS

## 2016-01-06 MED ORDER — IOPAMIDOL (ISOVUE-300) INJECTION 61%
100.0000 mL | Freq: Once | INTRAVENOUS | Status: AC | PRN
  Administered 2016-01-06: 100 mL via INTRAVENOUS

## 2016-01-06 MED ORDER — PROMETHAZINE HCL 25 MG/ML IJ SOLN
25.0000 mg | Freq: Once | INTRAMUSCULAR | Status: AC
  Administered 2016-01-06: 25 mg via INTRAMUSCULAR

## 2016-01-06 MED ORDER — PROMETHAZINE HCL 25 MG/ML IJ SOLN: 12.5000 mg | Freq: Once | INTRAMUSCULAR | Status: DC

## 2016-01-06 MED ORDER — IOPAMIDOL (ISOVUE-300) INJECTION 61%
30.0000 mL | Freq: Once | INTRAVENOUS | Status: AC | PRN
  Administered 2016-01-06: 30 mL via ORAL

## 2016-01-06 NOTE — ED Notes (Signed)
Pt vomiting at this time, pt given call bell to notify if she starts to become nauseated again, pt instructed to wait a few minutes before drinking contrast in order for the phenergan to have an opportunity to start giving her relief, no distress noted at this time,  cont to monitor

## 2016-01-06 NOTE — ED Triage Notes (Signed)
abd pain and vomited 3x today.

## 2016-01-06 NOTE — ED Provider Notes (Signed)
[facility] Emergency Department Provider Note    First MD Initiated Contact with Patient 01/06/16 1653     (approximate)  I have reviewed the triage vital signs and the nursing notes.   HISTORY  Chief Complaint Abdominal Pain    HPI Natasha Murray is a 75 y.o. female presents with 24 hours of epigastric pain and nausea and vomiting. Since states that she's been unable to tolerate oral today. No fevers. Does report chills. Denies any chest pain. No diaphoresis. Pain is located in the midepigastrium. States that she previously has been told that she has a hiatal hernia but this feels different from previous. Denies any cough or shortness of breath.   Past Medical History:  Diagnosis Date  . CAD (coronary artery disease)   . GERD (gastroesophageal reflux disease)   . HLD (hyperlipidemia)   . Hypertension   . MI, old 1998    Patient Active Problem List   Diagnosis Date Noted  . Intractable nausea and vomiting 12/16/2015  . AKI (acute kidney injury) ([place]) 12/16/2015  . Weakness 12/16/2015  . HTN (hypertension) 12/16/2015  . GERD (gastroesophageal reflux disease) 12/16/2015  . CAD (coronary artery disease) 12/16/2015    Past Surgical History:  Procedure Laterality Date  . ABDOMINAL HYSTERECTOMY    . CARDIAC SURGERY      Prior to Admission medications   Medication Sig Start Date End Date Taking? Authorizing Provider  ALPRAZolam (XANAX) 0.25 MG tablet Take 0.25 mg by mouth at bedtime.     Historical Provider, MD  amLODipine-benazepril (LOTREL) 10-40 MG capsule Take 1 capsule by mouth daily.    Historical Provider, MD  pravastatin (PRAVACHOL) 20 MG tablet Take 20 mg by mouth every evening.    Historical Provider, MD  promethazine (PHENERGAN) 12.5 MG tablet Take 1 tablet (12.5 mg total) by mouth every 6 (six) hours as needed for nausea or vomiting. 01/06/16   Merlyn Lot, MD  RABEprazole (ACIPHEX) 20 MG tablet Take 20 mg by mouth daily.     Historical Provider, MD    Allergies Review of patient's allergies indicates no known allergies.  Family History  Problem Relation Age of Onset  . Peripheral vascular disease Father   . Prostate cancer Brother   . CAD Mother   . Hypertension Mother     Social History Social History  Substance Use Topics  . Smoking status: Never Smoker  . Smokeless tobacco: Never Used  . Alcohol use No    Review of Systems Patient denies headaches, rhinorrhea, blurry vision, numbness, shortness of breath, chest pain, edema, cough, abdominal pain, nausea, vomiting, diarrhea, dysuria, fevers, rashes or hallucinations unless otherwise stated above in HPI. ____________________________________________   PHYSICAL EXAM:  VITAL SIGNS: Vitals:   01/06/16 2100 01/06/16 2101  BP: 139/82   Pulse: 83   Resp:  18  Temp:  98.9 F (37.2 C)    Constitutional: Alert and oriented. Elderly female in no acute distress Eyes: Conjunctivae are normal. PERRL. EOMI. Head: Atraumatic. Nose: No congestion/rhinnorhea. Mouth/Throat: Mucous membranes are moist.  Oropharynx non-erythematous. Neck: No stridor. Painless ROM. No cervical spine tenderness to palpation Hematological/Lymphatic/Immunilogical: No cervical lymphadenopathy. Cardiovascular: Normal rate, regular rhythm. Grossly normal heart sounds.  Good peripheral circulation. Respiratory: Normal respiratory effort.  No retractions. Lungs CTAB. Gastrointestinal: Soft but with mid epigastric tenderness to palpation No distention. No abdominal bruits. No CVA tenderness. Genitourinary:  Musculoskeletal: No lower extremity tenderness nor edema.  No joint effusions. Neurologic:  Normal speech and language.  No gross focal neurologic deficits are appreciated. No gait instability. Skin:  Skin is warm, dry and intact. No rash noted. Psychiatric: Mood and affect are normal. Speech and behavior are normal.  ____________________________________________   LABS (all  labs ordered are listed, but only abnormal results are displayed)  Results for orders placed or performed during the hospital encounter of 01/06/16 (from the past 24 hour(s))  Lipase, blood     Status: None   Collection Time: 01/06/16  3:56 PM  Result Value Ref Range   Lipase 26 11 - 51 U/L  Comprehensive metabolic panel     Status: Abnormal   Collection Time: 01/06/16  3:56 PM  Result Value Ref Range   Sodium 135 135 - 145 mmol/L   Potassium 3.6 3.5 - 5.1 mmol/L   Chloride 101 101 - 111 mmol/L   CO2 27 22 - 32 mmol/L   Glucose, Bld 135 (H) 65 - 99 mg/dL   BUN 14 6 - 20 mg/dL   Creatinine, Ser 1.01 (H) 0.44 - 1.00 mg/dL   Calcium 9.0 8.9 - 10.3 mg/dL   Total Protein 8.7 (H) 6.5 - 8.1 g/dL   Albumin 3.8 3.5 - 5.0 g/dL   AST 21 15 - 41 U/L   ALT 12 (L) 14 - 54 U/L   Alkaline Phosphatase 84 38 - 126 U/L   Total Bilirubin 0.8 0.3 - 1.2 mg/dL   GFR calc non Af Amer 53 (L) >60 mL/min   GFR calc Af Amer >60 >60 mL/min   Anion gap 7 5 - 15  CBC     Status: None   Collection Time: 01/06/16  3:56 PM  Result Value Ref Range   WBC 8.2 3.6 - 11.0 K/uL   RBC 4.90 3.80 - 5.20 MIL/uL   Hemoglobin 15.7 12.0 - 16.0 g/dL   HCT 45.5 35.0 - 47.0 %   MCV 92.8 80.0 - 100.0 fL   MCH 31.9 26.0 - 34.0 pg   MCHC 34.4 32.0 - 36.0 g/dL   RDW 13.3 11.5 - 14.5 %   Platelets 277 150 - 440 K/uL  Troponin I     Status: None   Collection Time: 01/06/16  3:56 PM  Result Value Ref Range   Troponin I <0.03 <0.03 ng/mL  Urinalysis complete, with microscopic     Status: Abnormal   Collection Time: 01/06/16  7:14 PM  Result Value Ref Range   Color, Urine YELLOW (A) YELLOW   APPearance CLEAR (A) CLEAR   Glucose, UA NEGATIVE NEGATIVE mg/dL   Bilirubin Urine NEGATIVE NEGATIVE   Ketones, ur NEGATIVE NEGATIVE mg/dL   Specific Gravity, Urine 1.016 1.005 - 1.030   Hgb urine dipstick NEGATIVE NEGATIVE   pH 5.0 5.0 - 8.0   Protein, ur 100 (A) NEGATIVE mg/dL   Nitrite NEGATIVE NEGATIVE   Leukocytes, UA NEGATIVE  NEGATIVE   RBC / HPF 0-5 0 - 5 RBC/hpf   WBC, UA 0-5 0 - 5 WBC/hpf   Bacteria, UA NONE SEEN NONE SEEN   Squamous Epithelial / LPF 0-5 (A) NONE SEEN   Mucous PRESENT    Hyaline Casts, UA PRESENT   Troponin I     Status: None   Collection Time: 01/06/16  7:14 PM  Result Value Ref Range   Troponin I <0.03 <0.03 ng/mL   ____________________________________________  EKG My review and personal interpretation at Time: 18:09   Indication: nausea  Rate: 70  Rhythm: sinus Axis: normal Other: nonspecific st changes that are  consistent with previous 12/16/15 ____________________________________________  RADIOLOGY  I personally reviewed all radiographic images ordered to evaluate for the above acute complaints and reviewed radiology reports and findings.  These findings were personally discussed with the patient.  Please see medical record for radiology report.  ____________________________________________   PROCEDURES  Procedure(s) performed: none    Critical Care performed: no ____________________________________________   INITIAL IMPRESSION / ASSESSMENT AND PLAN / ED COURSE  Pertinent labs & imaging results that were available during my care of the patient were reviewed by me and considered in my medical decision making (see chart for details).  DDX: Gastritis, pneumonia, obstruction, colitis, cholecystitis  Natasha Murray is a 75 y.o. who presents to the ED with complaint of epigastric pain nausea and vomiting. Patient does have mild tenderness to palpation in epigastrium. Otherwise afebrile and hemodynamic stable. Will order CT imaging to evaluate for acute process as well as laboratory evaluation and symptomatically management.  The patient will be placed on continuous pulse oximetry and telemetry for monitoring.  Laboratory evaluation will be sent to evaluate for the above complaints.     Clinical Course  Comment By Time  Patient reassessed. Really. Abdominal exam is  reassuring. Repeat troponin negative. EKG without any acute changes are suggestive of ischemia. Patient has tolerated oral hydration. CT imaging also reassuring. Discussed follow-up with primary care physician. Patient will be discharged with antiemetics and strict return precautions.  Have discussed with the patient and available family all diagnostics and treatments performed thus far and all questions were answered to the best of my ability. The patient demonstrates understanding and agreement with plan.  Merlyn Lot, MD 10/01 2036     ____________________________________________   FINAL CLINICAL IMPRESSION(S) / ED DIAGNOSES  Final diagnoses:  Epigastric pain  Non-intractable vomiting with nausea, unspecified vomiting type      NEW MEDICATIONS STARTED DURING THIS VISIT:  Discharge Medication List as of 01/06/2016  8:39 PM    START taking these medications   Details  promethazine (PHENERGAN) 12.5 MG tablet Take 1 tablet (12.5 mg total) by mouth every 6 (six) hours as needed for nausea or vomiting., Starting Sun 01/06/2016, Print         Note:  This document was prepared using Dragon voice recognition software and may include unintentional dictation errors.    Merlyn Lot, MD 01/06/16 2122

## 2016-01-06 NOTE — ED Notes (Signed)
Blood work obtained and sent. Pt in nad. Placed in lobby with daughter

## 2016-01-28 ENCOUNTER — Emergency Department
Admission: EM | Admit: 2016-01-28 | Discharge: 2016-01-28 | Disposition: A | Discharge status: Home / Self Care | Payer: Medicare Other | Attending: Emergency Medicine | Admitting: Emergency Medicine

## 2016-01-28 ENCOUNTER — Encounter: Payer: Self-pay | Admitting: Emergency Medicine

## 2016-01-28 DIAGNOSIS — Z79899 Other long term (current) drug therapy: Secondary | ICD-10-CM | POA: Insufficient documentation

## 2016-01-28 DIAGNOSIS — I2510 Atherosclerotic heart disease of native coronary artery without angina pectoris: Secondary | ICD-10-CM | POA: Insufficient documentation

## 2016-01-28 DIAGNOSIS — R112 Nausea with vomiting, unspecified: Secondary | ICD-10-CM

## 2016-01-28 DIAGNOSIS — R197 Diarrhea, unspecified: Secondary | ICD-10-CM

## 2016-01-28 DIAGNOSIS — I10 Essential (primary) hypertension: Secondary | ICD-10-CM | POA: Diagnosis not present

## 2016-01-28 DIAGNOSIS — K529 Noninfective gastroenteritis and colitis, unspecified: Secondary | ICD-10-CM | POA: Diagnosis not present

## 2016-01-28 LAB — COMPREHENSIVE METABOLIC PANEL
ALBUMIN: 3.9 g/dL (ref 3.5–5.0)
ALK PHOS: 90 U/L (ref 38–126)
ALT: 13 U/L — AB (ref 14–54)
ANION GAP: 8 (ref 5–15)
AST: 24 U/L (ref 15–41)
BUN: 18 mg/dL (ref 6–20)
CALCIUM: 8.9 mg/dL (ref 8.9–10.3)
CHLORIDE: 101 mmol/L (ref 101–111)
CO2: 27 mmol/L (ref 22–32)
CREATININE: 1.51 mg/dL — AB (ref 0.44–1.00)
GFR calc non Af Amer: 33 mL/min — ABNORMAL LOW (ref >60)
GFR, EST AFRICAN AMERICAN: 38 mL/min — AB (ref >60)
GLUCOSE: 169 mg/dL — AB (ref 65–99)
Potassium: 3.6 mmol/L (ref 3.5–5.1)
SODIUM: 136 mmol/L (ref 135–145)
Total Bilirubin: 0.6 mg/dL (ref 0.3–1.2)
Total Protein: 9 g/dL — ABNORMAL HIGH (ref 6.5–8.1)

## 2016-01-28 LAB — CBC
HCT: 44.9 % (ref 35.0–47.0)
HEMOGLOBIN: 15.9 g/dL (ref 12.0–16.0)
MCH: 32.9 pg (ref 26.0–34.0)
MCHC: 35.5 g/dL (ref 32.0–36.0)
MCV: 92.7 fL (ref 80.0–100.0)
Platelets: 317 10*3/uL (ref 150–440)
RBC: 4.84 MIL/uL (ref 3.80–5.20)
RDW: 13.6 % (ref 11.5–14.5)
WBC: 12.4 10*3/uL — ABNORMAL HIGH (ref 3.6–11.0)

## 2016-01-28 LAB — LIPASE, BLOOD: LIPASE: 25 U/L (ref 11–51)

## 2016-01-28 LAB — TROPONIN I: Troponin I: <0.03 ng/mL (ref <0.03)

## 2016-01-28 MED ORDER — ONDANSETRON HCL 4 MG/2ML IJ SOLN
4.0000 mg | Freq: Once | INTRAMUSCULAR | Status: AC
  Administered 2016-01-28: 4 mg via INTRAVENOUS
  Filled 2016-01-28: qty 2

## 2016-01-28 MED ORDER — PANTOPRAZOLE SODIUM 20 MG PO TBEC: 20.0000 mg | DELAYED_RELEASE_TABLET | Freq: Every day | ORAL | 1 refills | Status: DC

## 2016-01-28 MED ORDER — DEXAMETHASONE SODIUM PHOSPHATE 10 MG/ML IJ SOLN
8.0000 mg | Freq: Once | INTRAMUSCULAR | Status: AC
  Administered 2016-01-28: 8 mg via INTRAVENOUS
  Filled 2016-01-28: qty 1

## 2016-01-28 MED ORDER — ONDANSETRON 4 MG PO TBDP
4.0000 mg | ORAL_TABLET | Freq: Once | ORAL | Status: AC | PRN
  Administered 2016-01-28: 4 mg via ORAL
  Filled 2016-01-28: qty 1

## 2016-01-28 MED ORDER — SODIUM CHLORIDE 0.9 % IV BOLUS (SEPSIS)
1000.0000 mL | Freq: Once | INTRAVENOUS | Status: AC
  Administered 2016-01-28: 1000 mL via INTRAVENOUS

## 2016-01-28 MED ORDER — FENTANYL CITRATE (PF) 100 MCG/2ML IJ SOLN
50.0000 ug | Freq: Once | INTRAMUSCULAR | Status: AC
  Administered 2016-01-28: 50 ug via INTRAVENOUS
  Filled 2016-01-28: qty 2

## 2016-01-28 MED ORDER — ONDANSETRON 4 MG PO TBDP: 4.0000 mg | ORAL_TABLET | Freq: Three times a day (TID) | ORAL | 0 refills | Status: DC | PRN

## 2016-01-28 NOTE — Discharge Instructions (Signed)
As we discussed please follow-up with GI medicine by calling the number provided to arrange an appointment as soon as possible. Please take your medications as prescribed. Return to the emergency department if you develop any worsening abdominal pain, further significant vomiting, or develop any fever. Otherwise please follow-up with GI medicine as well as her primary care doctor for further evaluation.

## 2016-01-28 NOTE — ED Triage Notes (Signed)
Pt c/o epigastric pain with N/V/D since about 10pm last night; history of GERD; says for dinner last night she had greens and fried chicken wings; thinks the fried chicken wings have made her sick; last vomited about 1 hour ago; 3 diarrhea stools;

## 2016-01-28 NOTE — ED Provider Notes (Signed)
[facility] Emergency Department Provider Note  Time seen: 7:34 AM  I have reviewed the triage vital signs and the nursing notes.   HISTORY  Chief Complaint Abdominal Pain; Nausea; and Emesis    HPI Natasha Murray is a 75 y.o. female With a past medical history of gastric reflux disease, hypertension, hyperlipidemia, presents the emergency department for abdominal pain, nausea, vomiting, diarrhea. According to the patient around 10 PM last night she developed upper abdominal discomfort associated with nausea, several episodes of vomiting and 3 episodes of diarrhea. Patient states she has experienced these symptoms multiple times in the past. The last of which was approximately 3 weeks ago when the patient came to the emergency department for the same. At that time the patient had a largely normal workup besides a CT scan showing signs of enteritis, possibly an inflammatory bowel disease presentation. Patient states no personal or family history of inflammatory bowel disease. She states she completely improved after being discharged home 01/06/16 however the symptoms recurred last night. Prior to that she states they have occurred multiple times since 2013 but to a less severe degree. States her abdominal pain is currently moderate located mostly in the upper abdomen, was severe earlier. Continues to feel somewhat nauseated.  Past Medical History:  Diagnosis Date  . CAD (coronary artery disease)   . GERD (gastroesophageal reflux disease)   . HLD (hyperlipidemia)   . Hypertension   . MI, old 1998    Patient Active Problem List   Diagnosis Date Noted  . Intractable nausea and vomiting 12/16/2015  . AKI (acute kidney injury) ([place]) 12/16/2015  . Weakness 12/16/2015  . HTN (hypertension) 12/16/2015  . GERD (gastroesophageal reflux disease) 12/16/2015  . CAD (coronary artery disease) 12/16/2015    Past Surgical History:  Procedure Laterality Date  . ABDOMINAL  HYSTERECTOMY    . CARDIAC SURGERY      Prior to Admission medications   Medication Sig Start Date End Date Taking? Authorizing Provider  ALPRAZolam (XANAX) 0.25 MG tablet Take 0.25 mg by mouth at bedtime.    Yes Historical Provider, MD  hydrochlorothiazide (MICROZIDE) 12.5 MG capsule Take 12.5 mg by mouth daily.   Yes Historical Provider, MD  metoprolol (LOPRESSOR) 100 MG tablet Take 100 mg by mouth 2 (two) times daily.   Yes Historical Provider, MD  pravastatin (PRAVACHOL) 20 MG tablet Take 20 mg by mouth every evening.   Yes Historical Provider, MD  promethazine (PHENERGAN) 12.5 MG tablet Take 1 tablet (12.5 mg total) by mouth every 6 (six) hours as needed for nausea or vomiting. 01/06/16  Yes Merlyn Lot, MD  RABEprazole (ACIPHEX) 20 MG tablet Take 20 mg by mouth daily.   Yes Historical Provider, MD    Allergies  Allergen Reactions  . Codeine Nausea And Vomiting    Family History  Problem Relation Age of Onset  . Peripheral vascular disease Father   . Prostate cancer Brother   . CAD Mother   . Hypertension Mother     Social History Social History  Substance Use Topics  . Smoking status: Never Smoker  . Smokeless tobacco: Never Used  . Alcohol use No    Review of Systems Constitutional: Negative for fever. Cardiovascular: Negative for chest pain. Respiratory: Negative for shortness of breath. Gastrointestinal: upper abdominal pain. Positive for nausea, vomiting, diarrhea. Genitourinary: Negative for dysuria. Neurological: Negative for headache 10-point ROS otherwise negative.  ____________________________________________   PHYSICAL EXAM:  VITAL SIGNS: ED Triage Vitals  Enc  Vitals Group     BP 01/28/16 0223 140/74     Pulse Rate 01/28/16 0223 77     Resp 01/28/16 0223 18     Temp 01/28/16 0223 98.4 F (36.9 C)     Temp Source 01/28/16 0223 Oral     SpO2 01/28/16 0223 98 %     Weight 01/28/16 0224 160 lb (72.6 kg)     Height 01/28/16 0224 4\' 10"  (1.473  m)     Head Circumference --      Peak Flow --      Pain Score 01/28/16 0224 10     Pain Loc --      Pain Edu? --      Excl. in [place]? --     Constitutional: Alert and oriented. Well appearing and in no distress. Eyes: Normal exam ENT   Head: Normocephalic and atraumatic.   Mouth/Throat: Mucous membranes are moist. Cardiovascular: Normal rate, regular rhythm. No murmur Respiratory: Normal respiratory effort without tachypnea nor retractions. Breath sounds are clear Gastrointestinal: soft, mild upper abdominal tenderness palpation. No rebound or guarding. No distention. Musculoskeletal: Nontender with normal range of motion in all extremities.  Neurologic:  Normal speech and language. No gross focal neurologic deficits  Skin:  Skin is warm, dry and intact.  Psychiatric: Mood and affect are normal.   ____________________________________________    EKG  EKG reviewed and interpreted by myself shows sinus rhythm at 82 bpm, narrow QRS, normal axis, normal intervals, nonspecific ST changes including lateral T-wave inversions.unchanged compared to 01/06/16.   INITIAL IMPRESSION / ASSESSMENT AND PLAN / ED COURSE  Pertinent labs & imaging results that were available during my care of the patient were reviewed by me and considered in my medical decision making (see chart for details).  The patient presents emergency department with upper abdominal discomfort, nausea, vomiting, diarrhea. Patient states the symptoms have been coming and going for the last several years, but last night's episode was more severe. States the pain is resolving. Patient's labs are largely within normal limits/baseline besides a mild leukocytosis. Patient had a CT scan performed 10/1/17which was suggestive of a recurrent chronic process causing small bowel edema/enteritis.  Patient states this episode feels the same as prior episodes. I do not believe an additional CT scan would add much benefit to the patient. I  discussed with the patient the need to follow up with GI medicine for further evaluation. She is agreeable to do so. In the meantime we'll place an IV, and treat with nausea and pain medication and dose a one-time dose of IV steroids such as Decadron. Patient denies any fever or other infectious symptoms.  Patient states her pain is gone. She states she is feeling much better. Highly suspect some sort of chronic condition. Patient has received a dose of Decadron in the emergency department. We will discharge with Protonix and Zofran to be taken as needed. I discussed very strict return precautions for any worsening abdominal pain, return of vomiting, or any development of fever she is to return to the emergency department for further workup. Patient is agreeable to this plan.  ____________________________________________   FINAL CLINICAL IMPRESSION(S) / ED DIAGNOSES  Vomiting and diarrhea Upper abdominal pain Enteritis   Harvest Dark, MD 01/28/16 [phone]

## 2016-02-21 ENCOUNTER — Ambulatory Visit (INDEPENDENT_AMBULATORY_CARE_PROVIDER_SITE_OTHER): Payer: Medicare Other | Admitting: Gastroenterology

## 2016-02-21 ENCOUNTER — Other Ambulatory Visit: Payer: Self-pay

## 2016-02-21 ENCOUNTER — Other Ambulatory Visit
Admission: RE | Admit: 2016-02-21 | Discharge: 2016-02-21 | Disposition: A | Discharge status: Home / Self Care | Payer: Medicare Other | Source: Ambulatory Visit | Attending: Gastroenterology | Admitting: Gastroenterology

## 2016-02-21 VITALS — BP 146/79 | HR 60 | Temp 98.1°F | Ht <= 58 in | Wt 162.0 lb

## 2016-02-21 DIAGNOSIS — R112 Nausea with vomiting, unspecified: Secondary | ICD-10-CM | POA: Diagnosis present

## 2016-02-21 DIAGNOSIS — R109 Unspecified abdominal pain: Secondary | ICD-10-CM | POA: Diagnosis not present

## 2016-02-21 DIAGNOSIS — Z8719 Personal history of other diseases of the digestive system: Secondary | ICD-10-CM | POA: Diagnosis not present

## 2016-02-21 NOTE — Progress Notes (Signed)
Gastroenterology Consultation  Referring Provider:     Cletis Athens, MD Primary Care Physician:  Cletis Athens, MD Primary Gastroenterologist:  Dr. Allen Norris     Reason for Consultation:     Nausea and vomiting        HPI:   Natasha Murray is a 75 y.o. y/o female referred for consultation & management of Nausea and vomiting by Dr. Cletis Athens, MD.  This patient comes today with a history of nausea vomiting. The patient has been in the emergency room 3 times since September for the nausea and vomiting. The patient states that she does not have it every day but reports that 4-5 hours after she eats she will get abdominal pain and started having some vomiting. The patient was given some Phenergan in the ER and she went to sleep and felt better. The patient also had a CT scan in the ER that showed diffuse thickening of the small bowel consistent with possible Crohn's versus scleroderma versus lupus versus less likely a infectious process. The patient's CT scan of 2013 showed similar findings. The patient also had an upper endoscopy by Dr. Candace Cruise years ago that showed her to have intestinal metaplasia of her stomach. The patient has had no further follow-up of that.  Past Medical History:  Diagnosis Date  . CAD (coronary artery disease)   . GERD (gastroesophageal reflux disease)   . HLD (hyperlipidemia)   . Hypertension   . MI, old 26    Past Surgical History:  Procedure Laterality Date  . ABDOMINAL HYSTERECTOMY    . CARDIAC SURGERY      Prior to Admission medications   Medication Sig Start Date End Date Taking? Authorizing Provider  ALPRAZolam (XANAX) 0.25 MG tablet Take 0.25 mg by mouth at bedtime.    Yes Historical Provider, MD  amLODipine (NORVASC) 5 MG tablet  11/17/15  Yes Historical Provider, MD  aspirin EC 81 MG tablet Take 81 mg by mouth daily.   Yes Historical Provider, MD  hydrochlorothiazide (MICROZIDE) 12.5 MG capsule Take 12.5 mg by mouth daily.   Yes Historical Provider, MD    metoprolol (LOPRESSOR) 100 MG tablet Take 100 mg by mouth 2 (two) times daily.   Yes Historical Provider, MD  pantoprazole (PROTONIX) 20 MG tablet Take 1 tablet (20 mg total) by mouth daily. 01/28/16 01/27/17 Yes Harvest Dark, MD  pravastatin (PRAVACHOL) 20 MG tablet Take 20 mg by mouth every evening.   Yes Historical Provider, MD  ondansetron (ZOFRAN ODT) 4 MG disintegrating tablet Take 1 tablet (4 mg total) by mouth every 8 (eight) hours as needed for nausea or vomiting. Patient not taking: Reported on 02/21/2016 01/28/16   Harvest Dark, MD  promethazine (PHENERGAN) 12.5 MG tablet Take 1 tablet (12.5 mg total) by mouth every 6 (six) hours as needed for nausea or vomiting. Patient not taking: Reported on 02/21/2016 01/06/16   Merlyn Lot, MD  RABEprazole (ACIPHEX) 20 MG tablet Take 20 mg by mouth daily.    Historical Provider, MD    Family History  Problem Relation Age of Onset  . Peripheral vascular disease Father   . Prostate cancer Brother   . CAD Mother   . Hypertension Mother      Social History  Substance Use Topics  . Smoking status: Never Smoker  . Smokeless tobacco: Never Used  . Alcohol use No    Allergies as of 02/21/2016 - Review Complete 01/28/2016  Allergen Reaction Noted  . Codeine Nausea And Vomiting 01/28/2016  Review of Systems:    All systems reviewed and negative except where noted in HPI.   Physical Exam:  BP (!) 146/79   Pulse 60   Temp 98.1 F (36.7 C) (Oral)   Ht 4\' 10"  (1.473 m)   Wt 162 lb (73.5 kg)   BMI 33.86 kg/m  No LMP recorded. Patient has had a hysterectomy. Psych:  Alert and cooperative. Normal mood and affect. General:   Alert,  Well-developed, well-nourished, pleasant and cooperative in NAD Head:  Normocephalic and atraumatic. Eyes:  Sclera clear, no icterus.   Conjunctiva pink. Ears:  Normal auditory acuity. Nose:  No deformity, discharge, or lesions. Mouth:  No deformity or lesions,oropharynx pink &  moist. Neck:  Supple; no masses or thyromegaly. Lungs:  Respirations even and unlabored.  Clear throughout to auscultation.   No wheezes, crackles, or rhonchi. No acute distress. Heart:  Regular rate and rhythm; no murmurs, clicks, rubs, or gallops. Abdomen:  Normal bowel sounds.  No bruits.  Soft, non-tender and non-distended without masses, hepatosplenomegaly or hernias noted.  No guarding or rebound tenderness.  Negative Carnett sign.   Rectal:  Deferred.  Msk:  Symmetrical without gross deformities.  Good, equal movement & strength bilaterally. Pulses:  Normal pulses noted. Extremities:  No clubbing or edema.  No cyanosis. Neurologic:  Alert and oriented x3;  grossly normal neurologically. Skin:  Intact without significant lesions or rashes.  No jaundice. Lymph Nodes:  No significant cervical adenopathy. Psych:  Alert and cooperative. Normal mood and affect.  Imaging Studies: No results found.  Assessment and Plan:   Natasha Murray is a 75 y.o. y/o female who comes in today with recurrent nausea vomiting that is not intractable. The patient will be set up for a upper quadrant ultrasound with a HIDA scan and gallbladder emptying study. The patient will also be set up for a gastric emptying study due to vomiting food she ate 5 hours prior. She has a history of intestinal metaplasia and will be set up for an EGD and she has not had a colonoscopy in over 11 years. The patient will therefore be set up for colonoscopy also. She will have her work sent off for an IBD panel and ANA. The patient and her daughter have been explained the plan and agree with it.    Lucilla Lame, MD. [facility]   Note: This dictation was prepared with Dragon dictation along with smaller phrase technology. Any transcriptional errors that result from this process are unintentional.

## 2016-02-21 NOTE — Patient Instructions (Signed)
You are scheduled for an EGD and Colonoscopy at [facility] center on Dec 7th. You have been given instructions and a prescription today.  You are scheduled for a Gastric Emptying Study at [facility] outpatient imaging, [facility] location on Dec 13th @ 7:30am You are to stop any nausea medication and acid reducers 24 hours prior, no chewing gum or smoking. (This is a 4 hour exam)  You are scheduled for a RUQ [place] and HIDA scan at [facility] on Dec 18th @ 8:30am. You are to arrive at 8:15am and check in at the [facility]. You cannot eat or drink after midnight on Sunday night. (This is a 2 hour exam).  If you have any questions or concerns, please contact our office and ask for  at [phone].

## 2016-02-22 LAB — MISC LABCORP TEST (SEND OUT): LABCORP TEST CODE: 162045

## 2016-02-22 LAB — ANTINUCLEAR ANTIBODIES, IFA: ANTINUCLEAR ANTIBODIES, IFA: NEGATIVE

## 2016-02-25 ENCOUNTER — Telehealth: Payer: Self-pay

## 2016-02-25 NOTE — Telephone Encounter (Signed)
-----   Message from Lucilla Lame, MD sent at 02/24/2016  3:45 PM EST ----- The patient know that her blood tests did not show signs of inflammatory bowel disease

## 2016-02-25 NOTE — Telephone Encounter (Signed)
Left vm letting pt know lab results were normal.

## 2016-03-04 ENCOUNTER — Other Ambulatory Visit
Admission: RE | Admit: 2016-03-04 | Discharge: 2016-03-04 | Disposition: A | Discharge status: Home / Self Care | Payer: Medicare Other | Source: Ambulatory Visit | Attending: Internal Medicine | Admitting: Internal Medicine

## 2016-03-04 DIAGNOSIS — E785 Hyperlipidemia, unspecified: Secondary | ICD-10-CM | POA: Insufficient documentation

## 2016-03-04 DIAGNOSIS — I25810 Atherosclerosis of coronary artery bypass graft(s) without angina pectoris: Secondary | ICD-10-CM | POA: Insufficient documentation

## 2016-03-04 DIAGNOSIS — I10 Essential (primary) hypertension: Secondary | ICD-10-CM | POA: Insufficient documentation

## 2016-03-04 LAB — LIPID PANEL
Cholesterol: 169 mg/dL (ref 0–200)
HDL: 64 mg/dL (ref >40)
LDL Cholesterol: 79 mg/dL (ref 0–99)
Total CHOL/HDL Ratio: 2.6 RATIO
Triglycerides: 132 mg/dL (ref <150)
VLDL: 26 mg/dL (ref 0–40)

## 2016-03-04 LAB — BASIC METABOLIC PANEL
Anion gap: 8 (ref 5–15)
BUN: 17 mg/dL (ref 6–20)
CO2: 29 mmol/L (ref 22–32)
Calcium: 9.1 mg/dL (ref 8.9–10.3)
Chloride: 100 mmol/L — ABNORMAL LOW (ref 101–111)
Creatinine, Ser: 0.91 mg/dL (ref 0.44–1.00)
GFR calc Af Amer: >60 mL/min (ref >60)
GFR calc non Af Amer: >60 mL/min (ref >60)
Glucose, Bld: 108 mg/dL — ABNORMAL HIGH (ref 65–99)
Potassium: 3.9 mmol/L (ref 3.5–5.1)
Sodium: 137 mmol/L (ref 135–145)

## 2016-03-04 LAB — CBC
HCT: 38.6 % (ref 35.0–47.0)
Hemoglobin: 13.8 g/dL (ref 12.0–16.0)
MCH: 33 pg (ref 26.0–34.0)
MCHC: 35.7 g/dL (ref 32.0–36.0)
MCV: 92.4 fL (ref 80.0–100.0)
Platelets: 237 10*3/uL (ref 150–440)
RBC: 4.18 MIL/uL (ref 3.80–5.20)
RDW: 13.2 % (ref 11.5–14.5)
WBC: 5 10*3/uL (ref 3.6–11.0)

## 2016-03-04 LAB — TSH: TSH: 1.083 u[IU]/mL (ref 0.350–4.500)

## 2016-03-06 NOTE — Discharge Instructions (Signed)

## 2016-03-10 ENCOUNTER — Encounter: Payer: Self-pay | Admitting: *Deleted

## 2016-03-13 ENCOUNTER — Ambulatory Visit: Payer: Medicare Other | Admitting: Anesthesiology

## 2016-03-13 ENCOUNTER — Encounter
Admission: RE | Disposition: A | Discharge status: Home / Self Care | Payer: Self-pay | Source: Ambulatory Visit | Attending: Gastroenterology

## 2016-03-13 ENCOUNTER — Ambulatory Visit
Admission: RE | Admit: 2016-03-13 | Discharge: 2016-03-13 | Disposition: A | Discharge status: Home / Self Care | Payer: Medicare Other | Source: Ambulatory Visit | Attending: Gastroenterology | Admitting: Gastroenterology

## 2016-03-13 DIAGNOSIS — K449 Diaphragmatic hernia without obstruction or gangrene: Secondary | ICD-10-CM | POA: Insufficient documentation

## 2016-03-13 DIAGNOSIS — K3189 Other diseases of stomach and duodenum: Secondary | ICD-10-CM | POA: Diagnosis not present

## 2016-03-13 DIAGNOSIS — E785 Hyperlipidemia, unspecified: Secondary | ICD-10-CM | POA: Diagnosis not present

## 2016-03-13 DIAGNOSIS — Z79899 Other long term (current) drug therapy: Secondary | ICD-10-CM | POA: Insufficient documentation

## 2016-03-13 DIAGNOSIS — K2970 Gastritis, unspecified, without bleeding: Secondary | ICD-10-CM | POA: Diagnosis not present

## 2016-03-13 DIAGNOSIS — K5730 Diverticulosis of large intestine without perforation or abscess without bleeding: Secondary | ICD-10-CM | POA: Insufficient documentation

## 2016-03-13 DIAGNOSIS — K219 Gastro-esophageal reflux disease without esophagitis: Secondary | ICD-10-CM | POA: Insufficient documentation

## 2016-03-13 DIAGNOSIS — I252 Old myocardial infarction: Secondary | ICD-10-CM | POA: Insufficient documentation

## 2016-03-13 DIAGNOSIS — K641 Second degree hemorrhoids: Secondary | ICD-10-CM | POA: Diagnosis not present

## 2016-03-13 DIAGNOSIS — I10 Essential (primary) hypertension: Secondary | ICD-10-CM | POA: Insufficient documentation

## 2016-03-13 DIAGNOSIS — Z1211 Encounter for screening for malignant neoplasm of colon: Secondary | ICD-10-CM | POA: Diagnosis present

## 2016-03-13 DIAGNOSIS — Z7982 Long term (current) use of aspirin: Secondary | ICD-10-CM | POA: Insufficient documentation

## 2016-03-13 DIAGNOSIS — I2510 Atherosclerotic heart disease of native coronary artery without angina pectoris: Secondary | ICD-10-CM | POA: Diagnosis not present

## 2016-03-13 DIAGNOSIS — Z0000 Encounter for general adult medical examination without abnormal findings: Secondary | ICD-10-CM

## 2016-03-13 DIAGNOSIS — K2950 Unspecified chronic gastritis without bleeding: Secondary | ICD-10-CM | POA: Insufficient documentation

## 2016-03-13 DIAGNOSIS — D120 Benign neoplasm of cecum: Secondary | ICD-10-CM | POA: Diagnosis not present

## 2016-03-13 HISTORY — PX: POLYPECTOMY: SHX5525

## 2016-03-13 HISTORY — PX: ESOPHAGOGASTRODUODENOSCOPY (EGD) WITH PROPOFOL: SHX5813

## 2016-03-13 HISTORY — PX: COLONOSCOPY WITH PROPOFOL: SHX5780

## 2016-03-13 SURGERY — COLONOSCOPY WITH PROPOFOL
Anesthesia: Monitor Anesthesia Care | Wound class: Contaminated

## 2016-03-13 MED ORDER — PROPOFOL 10 MG/ML IV BOLUS
INTRAVENOUS | Status: DC | PRN
  Administered 2016-03-13: 100 mg via INTRAVENOUS
  Administered 2016-03-13 (×5): 20 mg via INTRAVENOUS
  Administered 2016-03-13: 30 mg via INTRAVENOUS
  Administered 2016-03-13: 20 mg via INTRAVENOUS

## 2016-03-13 MED ORDER — STERILE WATER FOR IRRIGATION IR SOLN
Status: DC | PRN
  Administered 2016-03-13: 09:00:00

## 2016-03-13 MED ORDER — LIDOCAINE HCL (CARDIAC) 20 MG/ML IV SOLN
INTRAVENOUS | Status: DC | PRN
  Administered 2016-03-13: 50 mg via INTRAVENOUS

## 2016-03-13 MED ORDER — LACTATED RINGERS IV SOLN
INTRAVENOUS | Status: DC
  Administered 2016-03-13: 08:00:00 via INTRAVENOUS

## 2016-03-13 MED ORDER — GLYCOPYRROLATE 0.2 MG/ML IJ SOLN
INTRAMUSCULAR | Status: DC | PRN
  Administered 2016-03-13: 0.1 mg via INTRAVENOUS

## 2016-03-13 SURGICAL SUPPLY — 35 items
BALLN DILATOR 10-12 8 (BALLOONS)
BALLN DILATOR 12-15 8 (BALLOONS)
BALLN DILATOR 15-18 8 (BALLOONS)
BALLN DILATOR CRE 0-12 8 (BALLOONS)
BALLN DILATOR ESOPH 8 10 CRE (MISCELLANEOUS) IMPLANT
BALLOON DILATOR 12-15 8 (BALLOONS) IMPLANT
BALLOON DILATOR 15-18 8 (BALLOONS) IMPLANT
BALLOON DILATOR CRE 0-12 8 (BALLOONS) IMPLANT
BLOCK BITE 60FR ADLT L/F GRN (MISCELLANEOUS) ×3 IMPLANT
CANISTER SUCT 1200ML W/VALVE (MISCELLANEOUS) ×3 IMPLANT
CLIP HMST 235XBRD CATH ROT (MISCELLANEOUS) IMPLANT
CLIP RESOLUTION 360 11X235 (MISCELLANEOUS)
FCP ESCP3.2XJMB 240X2.8X (MISCELLANEOUS) ×1
FORCEPS BIOP RAD 4 LRG CAP 4 (CUTTING FORCEPS) IMPLANT
FORCEPS BIOP RJ4 240 W/NDL (MISCELLANEOUS) ×2
FORCEPS ESCP3.2XJMB 240X2.8X (MISCELLANEOUS) ×1 IMPLANT
GOWN CVR UNV OPN BCK APRN NK (MISCELLANEOUS) ×2 IMPLANT
GOWN ISOL THUMB LOOP REG UNIV (MISCELLANEOUS) ×4
INJECTOR VARIJECT VIN23 (MISCELLANEOUS) IMPLANT
KIT DEFENDO VALVE AND CONN (KITS) IMPLANT
KIT ENDO PROCEDURE OLY (KITS) ×3 IMPLANT
MARKER SPOT ENDO TATTOO 5ML (MISCELLANEOUS) IMPLANT
PAD GROUND ADULT SPLIT (MISCELLANEOUS) IMPLANT
PROBE APC STR FIRE (PROBE) IMPLANT
RETRIEVER NET PLAT FOOD (MISCELLANEOUS) IMPLANT
RETRIEVER NET ROTH 2.5X230 LF (MISCELLANEOUS) IMPLANT
SNARE SHORT THROW 13M SML OVAL (MISCELLANEOUS) IMPLANT
SNARE SHORT THROW 30M LRG OVAL (MISCELLANEOUS) IMPLANT
SNARE SNG USE RND 15MM (INSTRUMENTS) IMPLANT
SPOT EX ENDOSCOPIC TATTOO (MISCELLANEOUS)
SYR INFLATION 60ML (SYRINGE) IMPLANT
TRAP ETRAP POLY (MISCELLANEOUS) IMPLANT
VARIJECT INJECTOR VIN23 (MISCELLANEOUS)
WATER STERILE IRR 250ML POUR (IV SOLUTION) ×3 IMPLANT
WIRE CRE 18-20MM 8CM F G (MISCELLANEOUS) IMPLANT

## 2016-03-13 NOTE — Op Note (Signed)
[facility] Gastroenterology Patient Name: Natasha Murray Procedure Date: 03/13/2016 8:30 AM MRN: [id number] Account #: [id number] Date of Birth: [birth year] Admit Type: Outpatient Age: 75 Room: [facility] OR ROOM 01 Gender: Female Note Status: Finalized Procedure:            Colonoscopy Indications:          Screening for colorectal malignant neoplasm Providers:            Lucilla Lame MD, MD Referring MD:         Cletis Athens, MD (Referring MD) Medicines:            Propofol per Anesthesia Complications:        No immediate complications. Procedure:            Pre-Anesthesia Assessment:                       - Prior to the procedure, a History and Physical was                        performed, and patient medications and allergies were                        reviewed. The patient's tolerance of previous                        anesthesia was also reviewed. The risks and benefits of                        the procedure and the sedation options and risks were                        discussed with the patient. All questions were                        answered, and informed consent was obtained. Prior                        Anticoagulants: The patient has taken no previous                        anticoagulant or antiplatelet agents. ASA Grade                        Assessment: II - A patient with mild systemic disease.                        After reviewing the risks and benefits, the patient was                        deemed in satisfactory condition to undergo the                        procedure.                       After obtaining informed consent, the colonoscope was                        passed under direct vision. Throughout the procedure,  the patient's blood pressure, pulse, and oxygen                        saturations were monitored continuously. The Olympus CF                        H180AL colonoscope (S#: [id number]) was introduced through                         the anus and advanced to the the cecum, identified by                        appendiceal orifice and ileocecal valve. The                        colonoscopy was performed without difficulty. The                        patient tolerated the procedure well. The quality of                        the bowel preparation was excellent. Findings:      The perianal and digital rectal examinations were normal.      A 2 mm polyp was found in the cecum. The polyp was sessile. The polyp       was removed with a cold biopsy forceps. Resection and retrieval were       complete.      Multiple small-mouthed diverticula were found in the entire colon.      Non-bleeding internal hemorrhoids were found during retroflexion. The       hemorrhoids were Grade II (internal hemorrhoids that prolapse but reduce       spontaneously). Impression:           - One 2 mm polyp in the cecum, removed with a cold                        biopsy forceps. Resected and retrieved.                       - Diverticulosis in the entire examined colon.                       - Non-bleeding internal hemorrhoids. Recommendation:       - Discharge patient to home.                       - Resume previous diet.                       - Continue present medications.                       - Await pathology results. Procedure Code(s):    --- Professional ---                       [phone], Colonoscopy, flexible; with biopsy, single or                        multiple Diagnosis Code(s):    --- Professional ---  Z12.11, Encounter for screening for malignant neoplasm                        of colon                       D12.0, Benign neoplasm of cecum CPT copyright 2016 American Medical Association. All rights reserved. The codes documented in this report are preliminary and upon coder review may  be revised to meet current compliance requirements. Lucilla Lame MD, MD 03/13/2016 9:03:44 AM This report  has been signed electronically. Number of Addenda: 0 Note Initiated On: 03/13/2016 8:30 AM Scope Withdrawal Time: 0 hours 8 minutes 40 seconds  Total Procedure Duration: 0 hours 13 minutes 10 seconds       [facility]

## 2016-03-13 NOTE — Anesthesia Procedure Notes (Signed)
Procedure Name: MAC Date/Time: 03/13/2016 8:37 AM Performed by: Cameron Ali Pre-anesthesia Checklist: Patient identified, Emergency Drugs available, Suction available, Timeout performed and Patient being monitored Patient Re-evaluated:Patient Re-evaluated prior to inductionOxygen Delivery Method: Nasal cannula Placement Confirmation: positive ETCO2

## 2016-03-13 NOTE — Transfer of Care (Signed)
Immediate Anesthesia Transfer of Care Note  Patient: Natasha Murray  Procedure(s) Performed: Procedure(s): COLONOSCOPY WITH PROPOFOL (N/A) ESOPHAGOGASTRODUODENOSCOPY (EGD) WITH PROPOFOL (N/A) POLYPECTOMY (N/A)  Patient Location: PACU  Anesthesia Type: MAC  Level of Consciousness: awake, alert  and patient cooperative  Airway and Oxygen Therapy: Patient Spontanous Breathing and Patient connected to supplemental oxygen  Post-op Assessment: Post-op Vital signs reviewed, Patient's Cardiovascular Status Stable, Respiratory Function Stable, Patent Airway and No signs of Nausea or vomiting  Post-op Vital Signs: Reviewed and stable  Complications: No apparent anesthesia complications

## 2016-03-13 NOTE — Anesthesia Postprocedure Evaluation (Signed)
Anesthesia Post Note  Patient: Natasha Murray  Procedure(s) Performed: Procedure(s) (LRB): COLONOSCOPY WITH PROPOFOL (N/A) ESOPHAGOGASTRODUODENOSCOPY (EGD) WITH PROPOFOL (N/A) POLYPECTOMY (N/A)  Patient location during evaluation: PACU Anesthesia Type: MAC Level of consciousness: awake and alert and oriented Pain management: satisfactory to patient Vital Signs Assessment: post-procedure vital signs reviewed and stable Respiratory status: spontaneous breathing, nonlabored ventilation and respiratory function stable Cardiovascular status: blood pressure returned to baseline and stable Postop Assessment: Adequate PO intake and No signs of nausea or vomiting Anesthetic complications: no    Raliegh Ip

## 2016-03-13 NOTE — Anesthesia Preprocedure Evaluation (Signed)
Anesthesia Evaluation  Patient identified by MRN, date of birth, ID band Patient awake    Reviewed: Allergy & Precautions, H&P , NPO status , Patient's Chart, lab work & pertinent test results  Airway Mallampati: II  TM Distance: >3 FB Neck ROM: full    Dental no notable dental hx.    Pulmonary    Pulmonary exam normal        Cardiovascular hypertension, + CAD and + Past MI  Normal cardiovascular exam     Neuro/Psych    GI/Hepatic GERD  ,  Endo/Other    Renal/GU Renal disease     Musculoskeletal   Abdominal   Peds  Hematology   Anesthesia Other Findings   Reproductive/Obstetrics                             Anesthesia Physical Anesthesia Plan  ASA: II  Anesthesia Plan: MAC   Post-op Pain Management:    Induction:   Airway Management Planned:   Additional Equipment:   Intra-op Plan:   Post-operative Plan:   Informed Consent: I have reviewed the patients History and Physical, chart, labs and discussed the procedure including the risks, benefits and alternatives for the proposed anesthesia with the patient or authorized representative who has indicated his/her understanding and acceptance.     Plan Discussed with:   Anesthesia Plan Comments:         Anesthesia Quick Evaluation

## 2016-03-13 NOTE — Op Note (Signed)
[facility] Gastroenterology Patient Name: Natasha Murray Procedure Date: 03/13/2016 8:30 AM MRN: [id number] Account #: [id number] Date of Birth: [birth year] Admit Type: Outpatient Age: 75 Room: [facility] OR ROOM 01 Gender: Female Note Status: Finalized Procedure:            Upper GI endoscopy Indications:          Follow-up of intestinal metaplasia Providers:            Lucilla Lame MD, MD Referring MD:         Cletis Athens, MD (Referring MD) Medicines:            Propofol per Anesthesia Complications:        No immediate complications. Procedure:            Pre-Anesthesia Assessment:                       - Prior to the procedure, a History and Physical was                        performed, and patient medications and allergies were                        reviewed. The patient's tolerance of previous                        anesthesia was also reviewed. The risks and benefits of                        the procedure and the sedation options and risks were                        discussed with the patient. All questions were                        answered, and informed consent was obtained. Prior                        Anticoagulants: The patient has taken no previous                        anticoagulant or antiplatelet agents. ASA Grade                        Assessment: II - A patient with mild systemic disease.                        After reviewing the risks and benefits, the patient was                        deemed in satisfactory condition to undergo the                        procedure.                       After obtaining informed consent, the endoscope was                        passed under direct vision. Throughout the procedure,  the patient's blood pressure, pulse, and oxygen                        saturations were monitored continuously. The Olympus                        GIF H180J colonscope [id number] was introduced             through the mouth, and advanced to the second part of                        duodenum. The upper GI endoscopy was accomplished                        without difficulty. The patient tolerated the procedure                        well. Findings:      A small hiatal hernia was present.      Diffuse moderate inflammation characterized by erythema was found in the       gastric antrum. Biopsies were taken with a cold forceps for histology.      The examined duodenum was normal. Impression:           - Small hiatal hernia.                       - Gastritis. Biopsied.                       - Normal examined duodenum. Recommendation:       - Await pathology results.                       - Discharge patient to home.                       - Resume previous diet.                       - Continue present medications.                       - Perform a colonoscopy today. Procedure Code(s):    --- Professional ---                       [phone], Esophagogastroduodenoscopy, flexible, transoral;                        with biopsy, single or multiple Diagnosis Code(s):    --- Professional ---                       K31.89, Other diseases of stomach and duodenum                       K29.70, Gastritis, unspecified, without bleeding CPT copyright 2016 American Medical Association. All rights reserved. The codes documented in this report are preliminary and upon coder review may  be revised to meet current compliance requirements. Lucilla Lame MD, MD 03/13/2016 8:45:57 AM This report has been signed electronically. Number of Addenda: 0 Note Initiated On: 03/13/2016 8:30 AM      [facility]

## 2016-03-13 NOTE — H&P (Signed)
Lucilla Lame, MD [facility] [address], [place], [place] [address] Phone: [phone] Fax : [phone]  Primary Care Physician:  Cletis Athens, MD Primary Gastroenterologist:  Dr. Allen Norris  Pre-Procedure History & Physical: HPI:  ADREONNA HINCK is a 75 y.o. female is here for an endoscopy and colonoscopy.   Past Medical History:  Diagnosis Date  . CAD (coronary artery disease)   . GERD (gastroesophageal reflux disease)   . HLD (hyperlipidemia)   . Hypertension   . MI, old 33    Past Surgical History:  Procedure Laterality Date  . ABDOMINAL HYSTERECTOMY    . CORONARY ARTERY BYPASS GRAFT  10/15/2011   5 vessel. [facility]    Prior to Admission medications   Medication Sig Start Date End Date Taking? Authorizing Provider  ALPRAZolam (XANAX) 0.25 MG tablet Take 0.25 mg by mouth at bedtime.    Yes Historical Provider, MD  amLODipine (NORVASC) 5 MG tablet  11/17/15  Yes Historical Provider, MD  aspirin EC 81 MG tablet Take 81 mg by mouth daily.   Yes Historical Provider, MD  hydrochlorothiazide (MICROZIDE) 12.5 MG capsule Take 12.5 mg by mouth daily.   Yes Historical Provider, MD  metoprolol (LOPRESSOR) 100 MG tablet Take 100 mg by mouth 2 (two) times daily.   Yes Historical Provider, MD  ondansetron (ZOFRAN ODT) 4 MG disintegrating tablet Take 1 tablet (4 mg total) by mouth every 8 (eight) hours as needed for nausea or vomiting. 01/28/16  Yes Harvest Dark, MD  pantoprazole (PROTONIX) 20 MG tablet Take 1 tablet (20 mg total) by mouth daily. 01/28/16 01/27/17 Yes Harvest Dark, MD  pravastatin (PRAVACHOL) 20 MG tablet Take 20 mg by mouth every evening.   Yes Historical Provider, MD  promethazine (PHENERGAN) 12.5 MG tablet Take 1 tablet (12.5 mg total) by mouth every 6 (six) hours as needed for nausea or vomiting. 01/06/16  Yes Merlyn Lot, MD  RABEprazole (ACIPHEX) 20 MG tablet Take 20 mg by mouth daily.    Historical Provider, MD    Allergies as of 02/21/2016 - Review  Complete 01/28/2016  Allergen Reaction Noted  . Codeine Nausea And Vomiting 01/28/2016    Family History  Problem Relation Age of Onset  . Peripheral vascular disease Father   . Prostate cancer Brother   . CAD Mother   . Hypertension Mother     Social History   Social History  . Marital status: Widowed    Spouse name: N/A  . Number of children: N/A  . Years of education: N/A   Occupational History  . Not on file.   Social History Main Topics  . Smoking status: Never Smoker  . Smokeless tobacco: Never Used  . Alcohol use No  . Drug use: No  . Sexual activity: Not on file   Other Topics Concern  . Not on file   Social History Narrative  . No narrative on file    Review of Systems: See HPI, otherwise negative ROS  Physical Exam: BP 104/63   Pulse 81   Temp 98 F (36.7 C) (Temporal)   Ht 4\' 10"  (1.473 m)   Wt 158 lb (71.7 kg)   SpO2 98%   BMI 33.02 kg/m  General:   Alert,  pleasant and cooperative in NAD Head:  Normocephalic and atraumatic. Neck:  Supple; no masses or thyromegaly. Lungs:  Clear throughout to auscultation.    Heart:  Regular rate and rhythm. Abdomen:  Soft, nontender and nondistended. Normal bowel sounds, without guarding, and without  rebound.   Neurologic:  Alert and  oriented x4;  grossly normal neurologically.  Impression/Plan: Lilla A Marsicano is here for an endoscopy and colonoscopy to be performed for barrett's and screening  Risks, benefits, limitations, and alternatives regarding  endoscopy and colonoscopy have been reviewed with the patient.  Questions have been answered.  All parties agreeable.   Lucilla Lame, MD  03/13/2016, 7:46 AM

## 2016-03-14 ENCOUNTER — Encounter: Payer: Self-pay | Admitting: Gastroenterology

## 2016-03-18 ENCOUNTER — Encounter: Payer: Self-pay | Admitting: Gastroenterology

## 2016-03-19 ENCOUNTER — Encounter
Admission: RE | Admit: 2016-03-19 | Discharge: 2016-03-19 | Disposition: A | Discharge status: Home / Self Care | Payer: Medicare Other | Source: Ambulatory Visit | Attending: Gastroenterology | Admitting: Gastroenterology

## 2016-03-19 DIAGNOSIS — R112 Nausea with vomiting, unspecified: Secondary | ICD-10-CM | POA: Insufficient documentation

## 2016-03-19 DIAGNOSIS — R109 Unspecified abdominal pain: Secondary | ICD-10-CM | POA: Diagnosis present

## 2016-03-19 MED ORDER — TECHNETIUM TC 99M SULFUR COLLOID
1.5000 | Freq: Once | INTRAVENOUS | Status: AC | PRN
  Administered 2016-03-19: 1.549 via ORAL

## 2016-03-24 ENCOUNTER — Ambulatory Visit
Admission: RE | Admit: 2016-03-24 | Discharge: 2016-03-24 | Disposition: A | Discharge status: Home / Self Care | Payer: Medicare Other | Source: Ambulatory Visit | Attending: Gastroenterology | Admitting: Gastroenterology

## 2016-03-24 ENCOUNTER — Encounter
Admission: RE | Admit: 2016-03-24 | Discharge: 2016-03-24 | Disposition: A | Discharge status: Home / Self Care | Payer: Medicare Other | Source: Ambulatory Visit | Attending: Gastroenterology | Admitting: Gastroenterology

## 2016-03-24 DIAGNOSIS — R932 Abnormal findings on diagnostic imaging of liver and biliary tract: Secondary | ICD-10-CM | POA: Diagnosis not present

## 2016-03-24 DIAGNOSIS — R112 Nausea with vomiting, unspecified: Secondary | ICD-10-CM | POA: Insufficient documentation

## 2016-03-24 DIAGNOSIS — R109 Unspecified abdominal pain: Secondary | ICD-10-CM

## 2016-03-24 MED ORDER — TECHNETIUM TC 99M TETROFOSMIN IV KIT
5.2400 | PACK | Freq: Once | INTRAVENOUS | Status: AC | PRN
  Administered 2016-03-24: 5.24 via INTRAVENOUS

## 2016-03-26 ENCOUNTER — Telehealth: Payer: Self-pay

## 2016-03-26 NOTE — Telephone Encounter (Signed)
-----   Message from Lucilla Lame, MD sent at 03/24/2016  1:19 PM EST ----- Let the patient know that the gallbladder ultrasound did not show any signs of gallstones or cause for her symptoms. It was suggestive of mild fatty liver but her liver enzymes have been normal.

## 2016-03-26 NOTE — Telephone Encounter (Signed)
-----   Message from Lucilla Lame, MD sent at 03/24/2016  6:34 PM EST ----- Let the patient know the gall bladder emptying study showed normal emptying and working fine.

## 2016-03-26 NOTE — Telephone Encounter (Signed)
-----   Message from Lucilla Lame, MD sent at 03/20/2016  8:37 AM EST ----- Let the patient know the gastric emptying study showed normal gastric emptying.

## 2016-03-26 NOTE — Telephone Encounter (Signed)
LVM for pt to return my call to review results as well as to schedule a follow up appt to discuss colonoscopy.

## 2016-03-26 NOTE — Telephone Encounter (Signed)
-----   Message from Lucilla Lame, MD sent at 03/20/2016 11:33 AM EST ----- Please have the patient come in for a follow up.

## 2016-03-27 ENCOUNTER — Telehealth: Payer: Self-pay

## 2016-03-27 NOTE — Telephone Encounter (Signed)
Pt notified of results. Scheduled follow up to discuss colonoscopy and EGD results.

## 2016-03-27 NOTE — Telephone Encounter (Signed)
Pt notified of results and scheduled for a follow up appt to discuss colonoscopy/EGD results.

## 2016-03-27 NOTE — Telephone Encounter (Signed)
-----   Message from Lucilla Lame, MD sent at 03/20/2016 11:33 AM EST ----- Please have the patient come in for a follow up.

## 2016-04-16 ENCOUNTER — Encounter: Payer: Self-pay | Admitting: Gastroenterology

## 2016-04-16 ENCOUNTER — Ambulatory Visit (INDEPENDENT_AMBULATORY_CARE_PROVIDER_SITE_OTHER): Payer: Medicare Other | Admitting: Gastroenterology

## 2016-04-16 VITALS — BP 154/71 | HR 63 | Temp 98.1°F | Ht <= 58 in | Wt 164.0 lb

## 2016-04-16 DIAGNOSIS — R112 Nausea with vomiting, unspecified: Secondary | ICD-10-CM | POA: Diagnosis not present

## 2016-04-16 MED ORDER — PROMETHAZINE HCL 12.5 MG RE SUPP: 12.5000 mg | Freq: Four times a day (QID) | RECTAL | 1 refills | Status: DC | PRN

## 2016-04-16 NOTE — Patient Instructions (Signed)
You will need a repeat EGD in 3 years as long as you are healthy.  No further colonoscopy will be required unless you develop any problems.  Stop the Aciphex. Restart if you experience any reflux symptoms.   We have sent a prescription for Phenergan Suppositories to your pharmacy.

## 2016-04-16 NOTE — Progress Notes (Signed)
Primary Care Physician: Cletis Athens, MD  Primary Gastroenterologist:  Dr. Lucilla Lame  Chief Complaint  Patient presents with  . Follow up results    HPI: Natasha Murray is a 76 y.o. female here for follow-up after her EGD and colonoscopy. The patient also had a gastric empty study and a gallbladder empty study. The patient's gastric emptying study showed no emptying at 1 hour but at our 23 and for the emptying was normal with a total of 99% of the contents of her stomach being emptied at 4 hours. The gallbladder emptying study showed her gallbladder emptying at 98%. The patient had intestinal metaplasia seen on her upper endoscopy and she had a single small adenomatous polyp in the cecum.  Current Outpatient Prescriptions  Medication Sig Dispense Refill  . ALPRAZolam (XANAX) 0.25 MG tablet Take 0.25 mg by mouth at bedtime.     Marland Kitchen amLODipine (NORVASC) 5 MG tablet     . aspirin EC 81 MG tablet Take 81 mg by mouth daily.    . hydrochlorothiazide (MICROZIDE) 12.5 MG capsule Take 12.5 mg by mouth daily.    . metoprolol (LOPRESSOR) 100 MG tablet Take 100 mg by mouth 2 (two) times daily.    . ondansetron (ZOFRAN ODT) 4 MG disintegrating tablet Take 1 tablet (4 mg total) by mouth every 8 (eight) hours as needed for nausea or vomiting. 20 tablet 0  . pantoprazole (PROTONIX) 20 MG tablet Take 1 tablet (20 mg total) by mouth daily. 30 tablet 1  . pravastatin (PRAVACHOL) 20 MG tablet Take 20 mg by mouth every evening.    . promethazine (PHENERGAN) 12.5 MG tablet Take 1 tablet (12.5 mg total) by mouth every 6 (six) hours as needed for nausea or vomiting. 12 tablet 0  . RABEprazole (ACIPHEX) 20 MG tablet Take 20 mg by mouth daily.    . promethazine (PHENERGAN) 12.5 MG suppository Place 1 suppository (12.5 mg total) rectally every 6 (six) hours as needed for nausea or vomiting. 12 each 1   No current facility-administered medications for this visit.     Allergies as of 04/16/2016 - Review  Complete 04/16/2016  Allergen Reaction Noted  . Codeine Nausea And Vomiting 01/28/2016    ROS:  General: Negative for anorexia, weight loss, fever, chills, fatigue, weakness. ENT: Negative for hoarseness, difficulty swallowing , nasal congestion. CV: Negative for chest pain, angina, palpitations, dyspnea on exertion, peripheral edema.  Respiratory: Negative for dyspnea at rest, dyspnea on exertion, cough, sputum, wheezing.  GI: See history of present illness. GU:  Negative for dysuria, hematuria, urinary incontinence, urinary frequency, nocturnal urination.  Endo: Negative for unusual weight change.    Physical Examination:   BP (!) 154/71   Pulse 63   Temp 98.1 F (36.7 C) (Oral)   Ht 4\' 10"  (1.473 m)   Wt 164 lb (74.4 kg)   BMI 34.28 kg/m   General: Well-nourished, well-developed in no acute distress.  Eyes: No icterus. Conjunctivae pink. Extremities: No lower extremity edema. No clubbing or deformities. Neuro: Alert and oriented x 3.  Grossly intact. Skin: Warm and dry, no jaundice.   Psych: Alert and cooperative, normal mood and affect.  Labs:    Imaging Studies: Nm Gastric Emptying  Result Date: 03/19/2016 CLINICAL DATA:  Abdominal pain for 3 months, nausea, vomiting, history coronary artery disease post MI EXAM: NUCLEAR MEDICINE GASTRIC EMPTYING SCAN TECHNIQUE: After oral ingestion of radiolabeled meal, sequential abdominal images were obtained for 4 hours. Percentage of activity emptying the stomach  was calculated at 1 hour, 2 hour, 3 hour, and 4 hours. RADIOPHARMACEUTICALS:  1.549 mCi Tc-6m sulfur colloid in standardized meal COMPARISON:  None FINDINGS: Expected location of the stomach in the left upper quadrant. Ingested meal empties the stomach gradually over the course of the study. 0% emptied at 1 hr ( normal >= 10%) 61% emptied at 2 hr ( normal >= 40%) 92% emptied at 3 hr ( normal >= 70%) 99% emptied at 4 hr ( normal >= 90%) IMPRESSION: Normal gastric emptying.  Electronically Signed   By: Lavonia Dana M.D.   On: 03/19/2016 13:08   Nm Hepato W/eject Fract  Result Date: 03/24/2016 CLINICAL DATA:  Abdominal pain over the last 3-4 months EXAM: NUCLEAR MEDICINE HEPATOBILIARY IMAGING WITH GALLBLADDER EF TECHNIQUE: Sequential images of the abdomen were obtained out to 60 minutes following intravenous administration of radiopharmaceutical. After oral ingestion of Ensure, gallbladder ejection fraction was determined. At 60 min, normal ejection fraction is greater than 33%. RADIOPHARMACEUTICALS:  5.2 mCi Tc-22m  Choletec IV COMPARISON:  Abdominal ultrasound 03/24/2016 FINDINGS: Satisfactory uptake of radiopharmaceutical from the blood pool. Biliary activity visible at 14 minutes. Gallbladder activity visible at 22 minutes. Bowel activity visible at 42 minutes. No symptoms with oral consumption of meal. Calculated gallbladder ejection fraction is 98%. (Normal gallbladder ejection fraction with Ensure is greater than 33%.) IMPRESSION: Normal hepatobiliary scan with gallbladder ejection fraction of 98%. Electronically Signed   By: Van Clines M.D.   On: 03/24/2016 14:37   US Abdomen Limited Ruq  Result Date: 03/24/2016 CLINICAL DATA:  Abdominal pain with nausea and vomiting over the past 4 months. Currently asymptomatic. EXAM: US ABDOMEN LIMITED - RIGHT UPPER QUADRANT COMPARISON:  Gastric emptying study of March 19, 2016 and abdominal and pelvic CT scan of January 06, 2016. FINDINGS: Gallbladder: No gallstones or wall thickening visualized. No sonographic Murphy sign noted by sonographer. Common bile duct: Diameter: 2.6 mm Liver: The hepatic echotexture is mildly increased. There is no focal mass or ductal dilation. The surface contour of the liver is normal. IMPRESSION: No gallstones or sonographic evidence of acute cholecystitis. If there are clinical concerns of chronic cholecystitis, a nuclear medicine hepatobiliary scan would be useful. Mildly increased hepatic  echotexture likely reflects fatty infiltrative change. Electronically Signed   By: David  Martinique M.D.   On: 03/24/2016 08:55    Assessment and Plan:   Natasha Murray is a 76 y.o. y/o female who comes in today for follow-up after having an EGD and colonoscopy. The patient also had a gastric emptying study and a gallbladder emptying study. The patient's gallbladder empty study was normal and so was the gastric empty study. The patient's colon showed a adenomatous polyp and a repeat colonoscopy is not needed due to her age of 84. The patient has been told if she has any lower GI problems then we would consider repeating the colonoscopy. As for her upper endoscopy with the intestinal metaplasia found in the antrum I recommended if she is in good health she should have a repeat upper endoscopy and 3 years. The patient will have her Phenergan refilled and she will try to see if stopping AcipHex makes her symptoms any worse. The patient has been explained the plan and agrees with it.    Lucilla Lame, MD. Marval Regal   Note: This dictation was prepared with Dragon dictation along with smaller phrase technology. Any transcriptional errors that result from this process are unintentional.

## 2016-07-21 ENCOUNTER — Other Ambulatory Visit: Payer: Self-pay | Admitting: Internal Medicine

## 2016-07-21 DIAGNOSIS — Z1231 Encounter for screening mammogram for malignant neoplasm of breast: Secondary | ICD-10-CM

## 2016-08-08 ENCOUNTER — Ambulatory Visit
Admission: RE | Admit: 2016-08-08 | Discharge: 2016-08-08 | Disposition: A | Discharge status: Home / Self Care | Payer: Medicare Other | Source: Ambulatory Visit | Attending: Internal Medicine | Admitting: Internal Medicine

## 2016-08-08 DIAGNOSIS — Z1231 Encounter for screening mammogram for malignant neoplasm of breast: Secondary | ICD-10-CM | POA: Insufficient documentation

## 2017-04-09 ENCOUNTER — Other Ambulatory Visit
Admission: RE | Admit: 2017-04-09 | Discharge: 2017-04-09 | Disposition: A | Discharge status: Home / Self Care | Payer: Medicare Other | Source: Ambulatory Visit | Attending: Internal Medicine | Admitting: Internal Medicine

## 2017-04-09 DIAGNOSIS — I25810 Atherosclerosis of coronary artery bypass graft(s) without angina pectoris: Secondary | ICD-10-CM | POA: Diagnosis not present

## 2017-04-09 DIAGNOSIS — I10 Essential (primary) hypertension: Secondary | ICD-10-CM | POA: Diagnosis present

## 2017-04-09 DIAGNOSIS — E785 Hyperlipidemia, unspecified: Secondary | ICD-10-CM | POA: Insufficient documentation

## 2017-04-09 LAB — LIPID PANEL
Cholesterol: 222 mg/dL — ABNORMAL HIGH (ref 0–200)
HDL: 66 mg/dL (ref >40)
LDL Cholesterol: 119 mg/dL — ABNORMAL HIGH (ref 0–99)
Total CHOL/HDL Ratio: 3.4 RATIO
Triglycerides: 183 mg/dL — ABNORMAL HIGH (ref <150)
VLDL: 37 mg/dL (ref 0–40)

## 2017-04-09 LAB — BASIC METABOLIC PANEL
Anion gap: 10 (ref 5–15)
BUN: 16 mg/dL (ref 6–20)
CO2: 29 mmol/L (ref 22–32)
Calcium: 8.6 mg/dL — ABNORMAL LOW (ref 8.9–10.3)
Chloride: 97 mmol/L — ABNORMAL LOW (ref 101–111)
Creatinine, Ser: 1.12 mg/dL — ABNORMAL HIGH (ref 0.44–1.00)
GFR calc Af Amer: 54 mL/min — ABNORMAL LOW (ref >60)
GFR calc non Af Amer: 47 mL/min — ABNORMAL LOW (ref >60)
Glucose, Bld: 117 mg/dL — ABNORMAL HIGH (ref 65–99)
Potassium: 2.9 mmol/L — ABNORMAL LOW (ref 3.5–5.1)
Sodium: 136 mmol/L (ref 135–145)

## 2017-04-09 LAB — CBC
HCT: 40.4 % (ref 35.0–47.0)
Hemoglobin: 14.2 g/dL (ref 12.0–16.0)
MCH: 32.1 pg (ref 26.0–34.0)
MCHC: 35.1 g/dL (ref 32.0–36.0)
MCV: 91.6 fL (ref 80.0–100.0)
Platelets: 244 10*3/uL (ref 150–440)
RBC: 4.41 MIL/uL (ref 3.80–5.20)
RDW: 13.7 % (ref 11.5–14.5)
WBC: 4.5 10*3/uL (ref 3.6–11.0)

## 2017-04-09 LAB — TSH: TSH: 2.584 u[IU]/mL (ref 0.350–4.500)

## 2017-07-02 ENCOUNTER — Other Ambulatory Visit: Payer: Self-pay | Admitting: Internal Medicine

## 2017-07-02 DIAGNOSIS — Z1231 Encounter for screening mammogram for malignant neoplasm of breast: Secondary | ICD-10-CM

## 2017-08-10 ENCOUNTER — Ambulatory Visit
Admission: RE | Admit: 2017-08-10 | Discharge: 2017-08-10 | Disposition: A | Discharge status: Home / Self Care | Payer: Medicare Other | Source: Ambulatory Visit | Attending: Internal Medicine | Admitting: Internal Medicine

## 2017-08-10 DIAGNOSIS — Z1231 Encounter for screening mammogram for malignant neoplasm of breast: Secondary | ICD-10-CM | POA: Insufficient documentation

## 2017-10-16 IMAGING — CR DG CHEST 2V
2 series · 2 of 2 positions shown · non-contrast
Comparison: 04/26/2006

CLINICAL DATA: Nausea and vomiting.  Concern for pneumonia.

EXAM:
CHEST  2 VIEW

[chest lat]
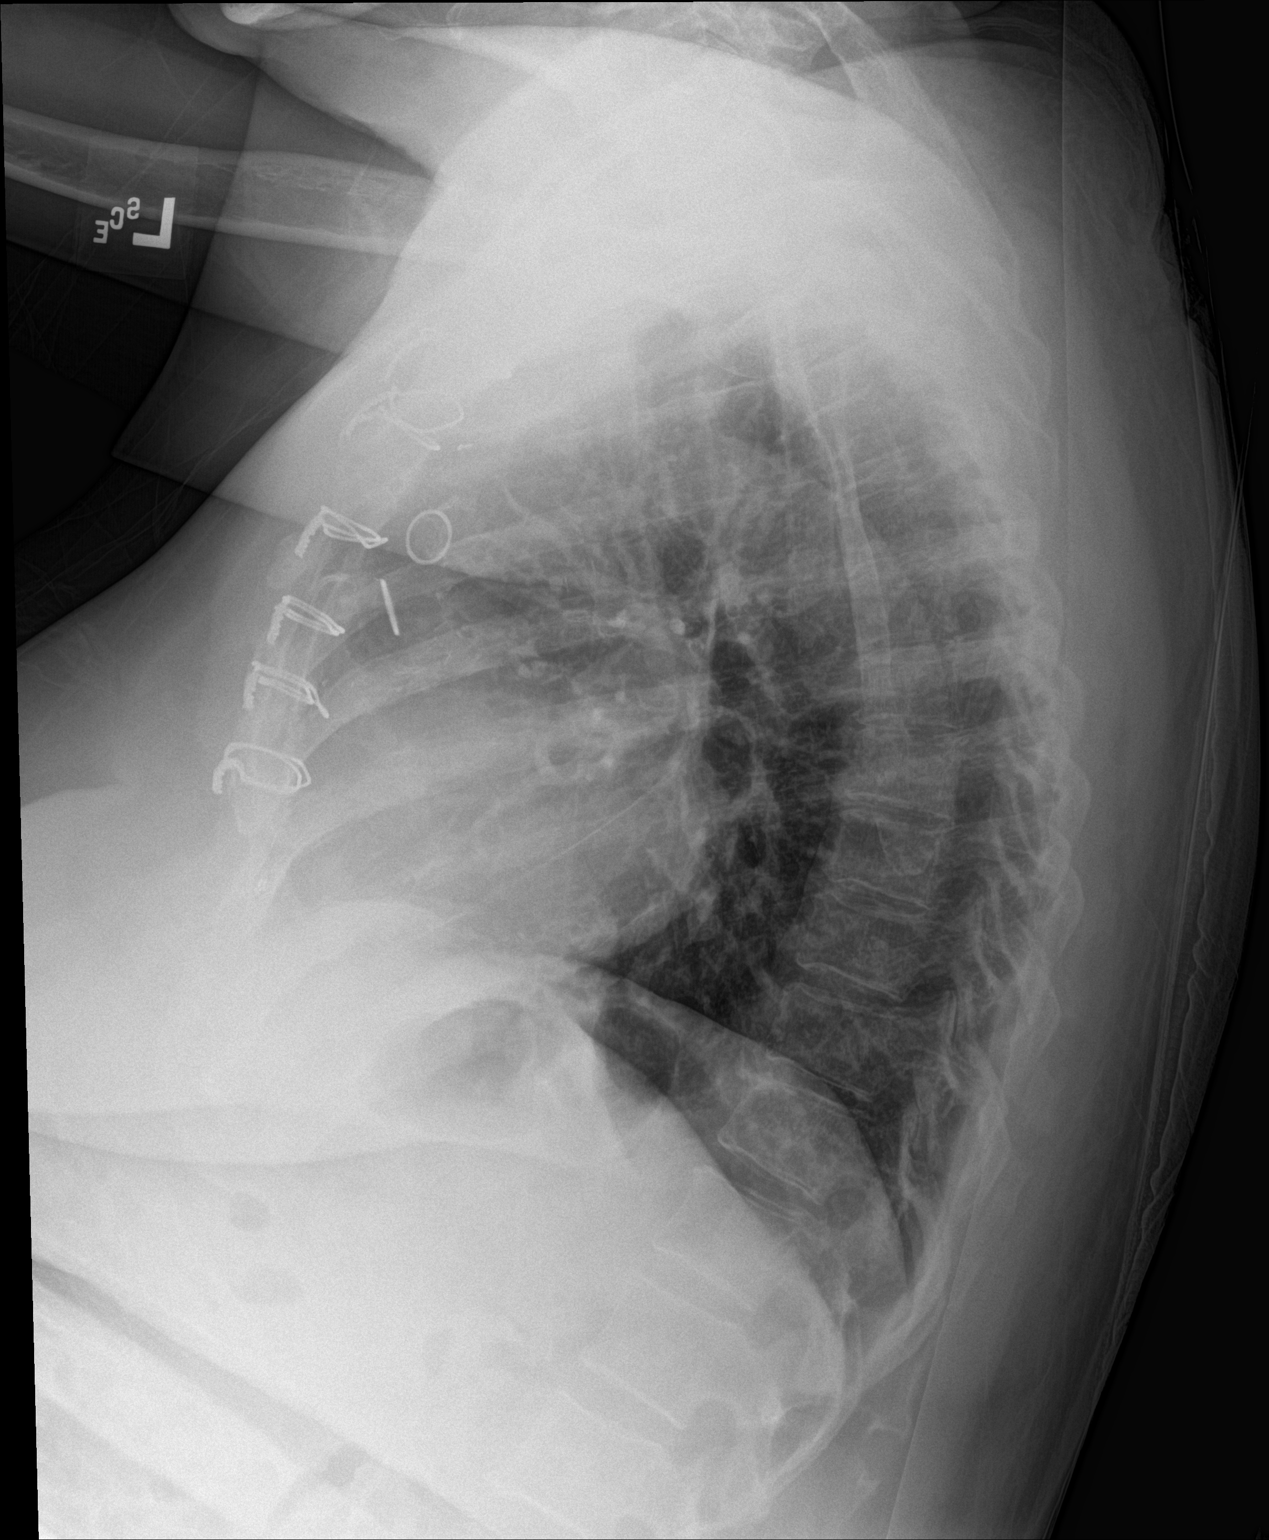

[chest ap]
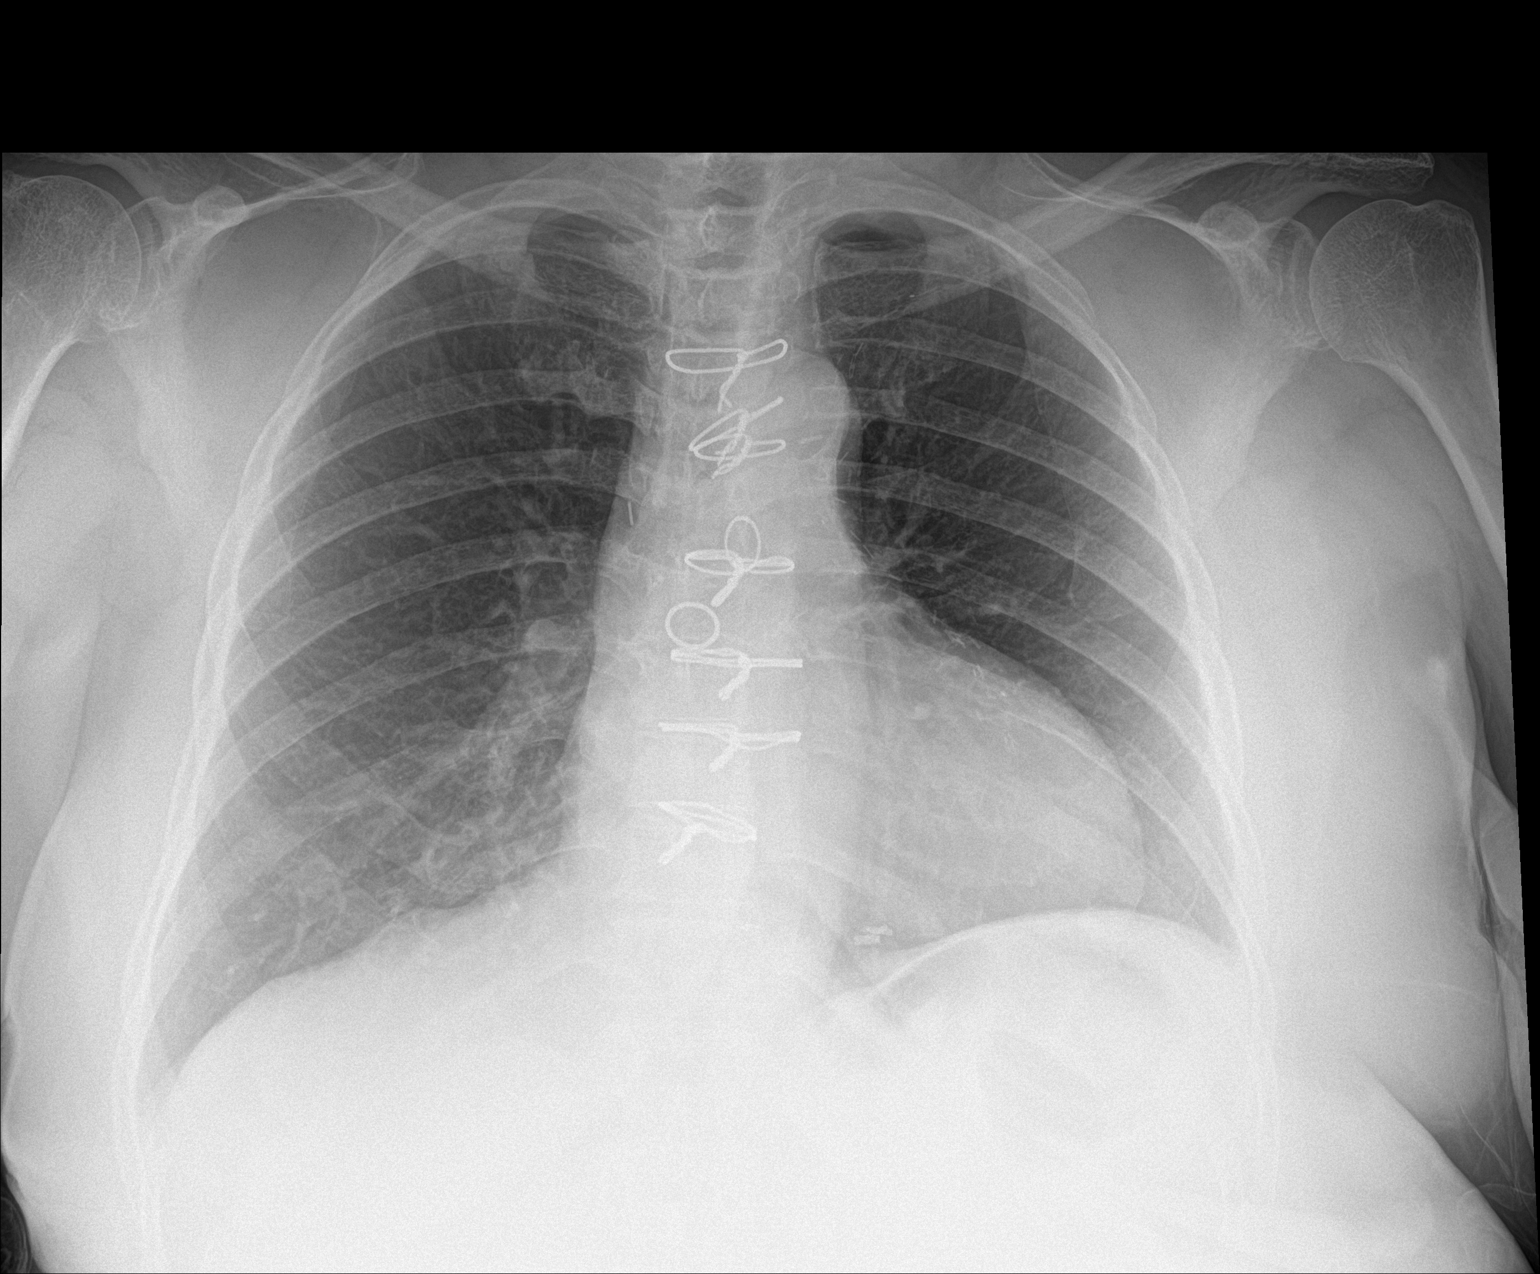

[2 of 2 positions shown; findings below may reference images not displayed]

FINDINGS: Probable left ventricular enlargement. Status post CABG. Negative
aortic and hilar contours. There is no edema, consolidation,
effusion, or pneumothorax.
IMPRESSION: No evidence of active disease.

## 2018-01-02 IMAGING — US US ABDOMEN LIMITED
2 series · 14 of 25 positions shown · non-contrast
Comparison: Gastric emptying study March 19, 2016 and
abdominal and pelvic CT scan January 06, 2016.

CLINICAL DATA: Abdominal pain with nausea and vomiting over the
past 4 months. Currently asymptomatic.

EXAM:
US ABDOMEN LIMITED - RIGHT UPPER QUADRANT

[Series 1: us abdomen limited · 0.19mm/px · 13 of 48 slices shown (1 of 2)]
[im 1/48]
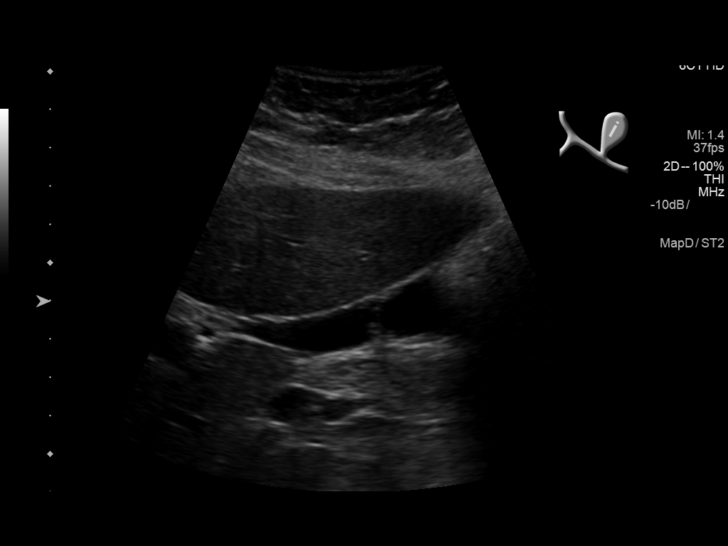
[im 5/48]
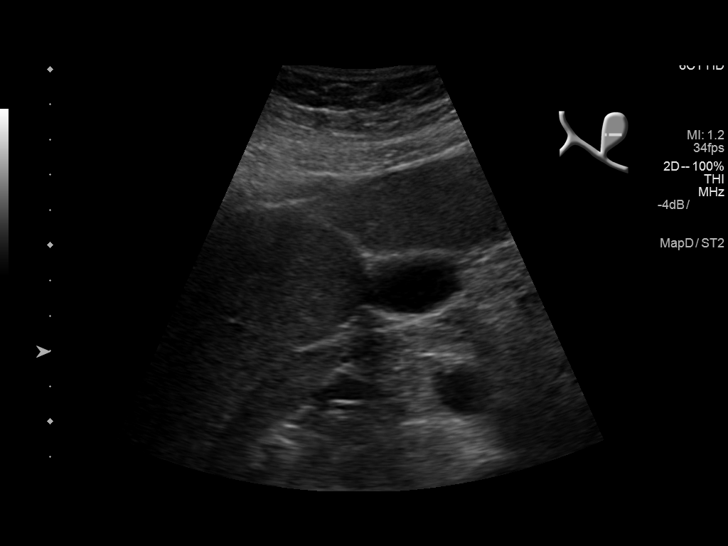
[im 9/48]
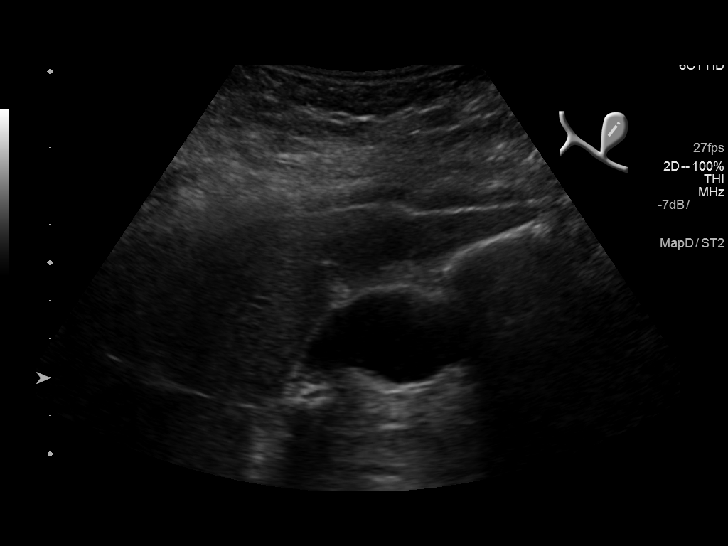
[im 13/48]
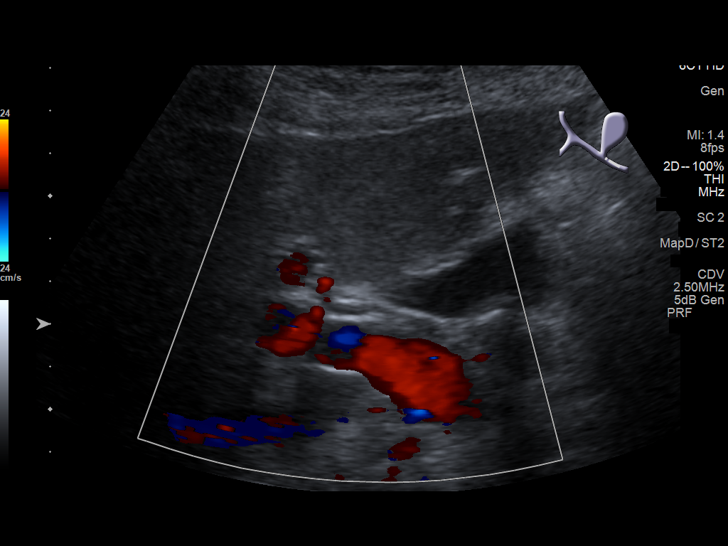
[im 17/48]
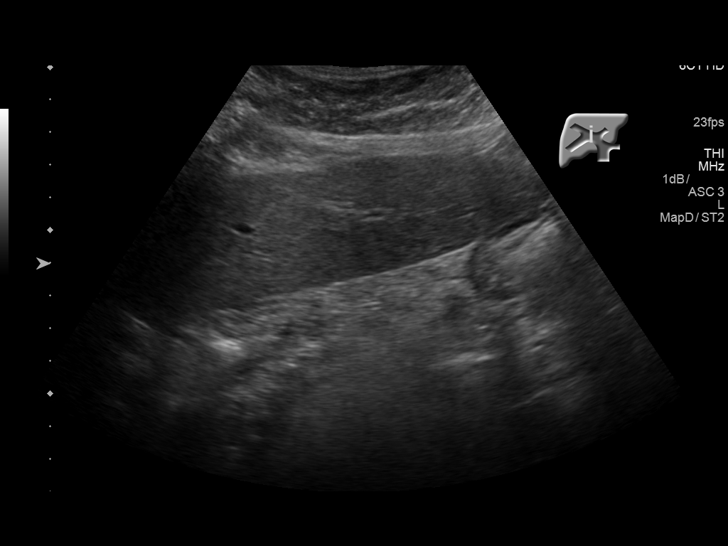
[im 19/48]
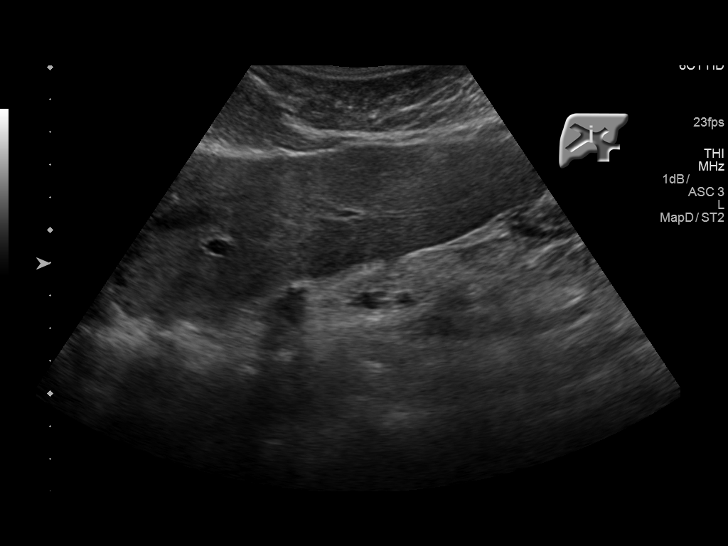
[im 23/48]
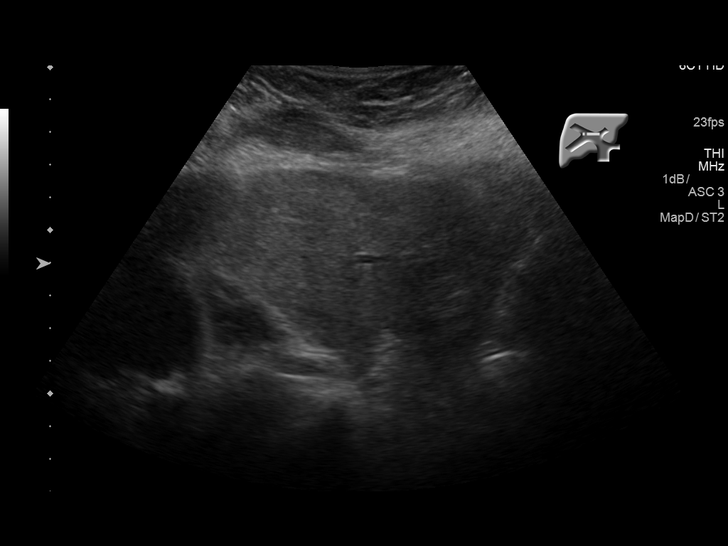
[im 27/48]
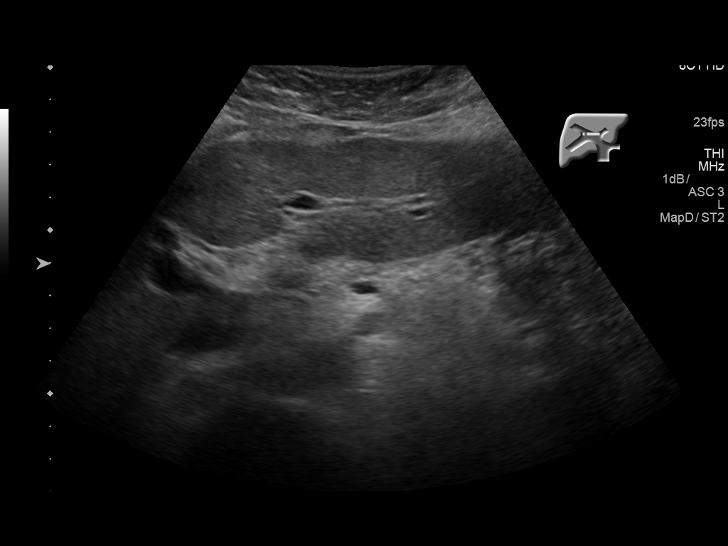
[im 31/48]
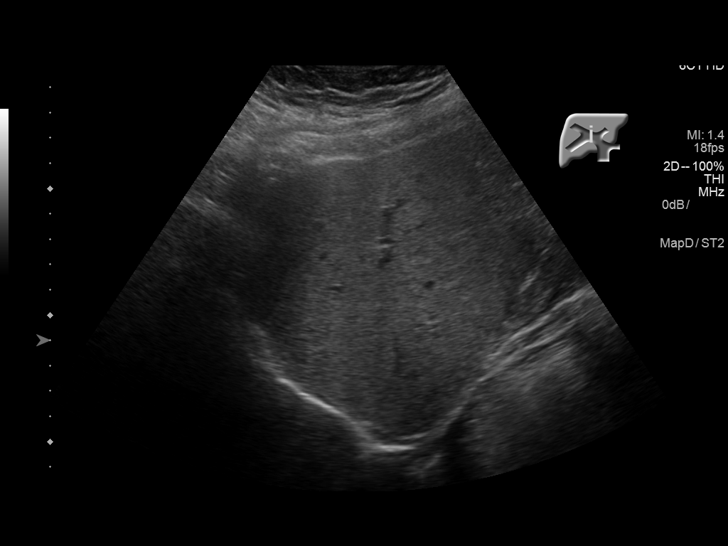
[im 33/48]
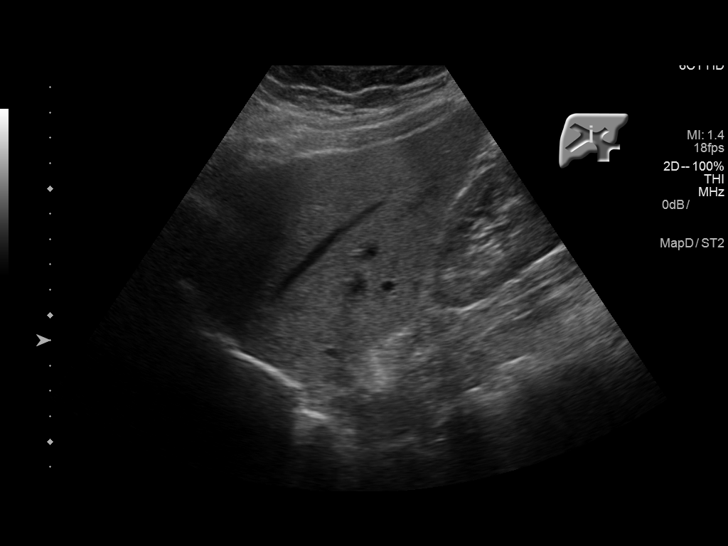
[im 37/48]
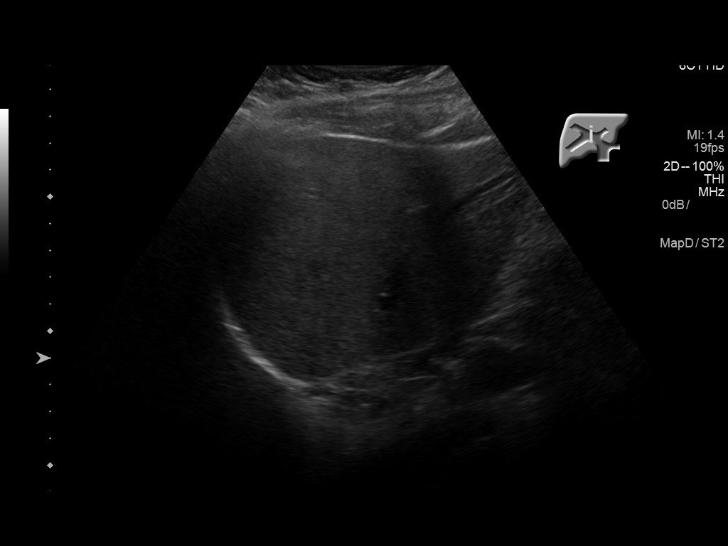
[im 41/48]
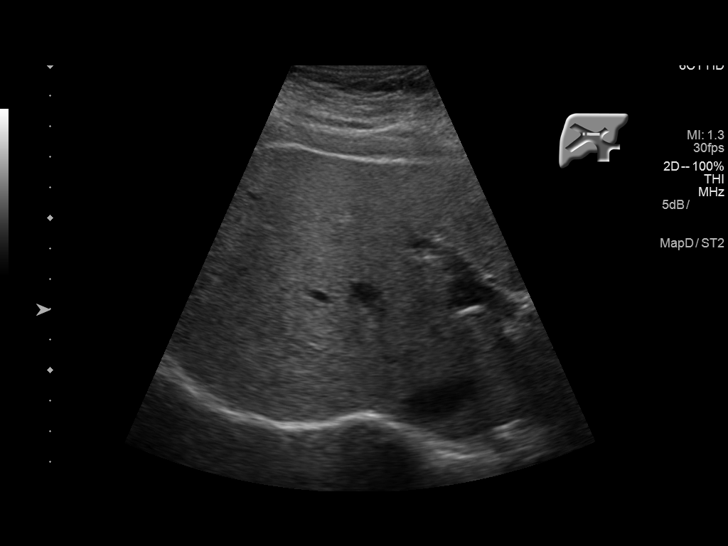
[im 45/48]
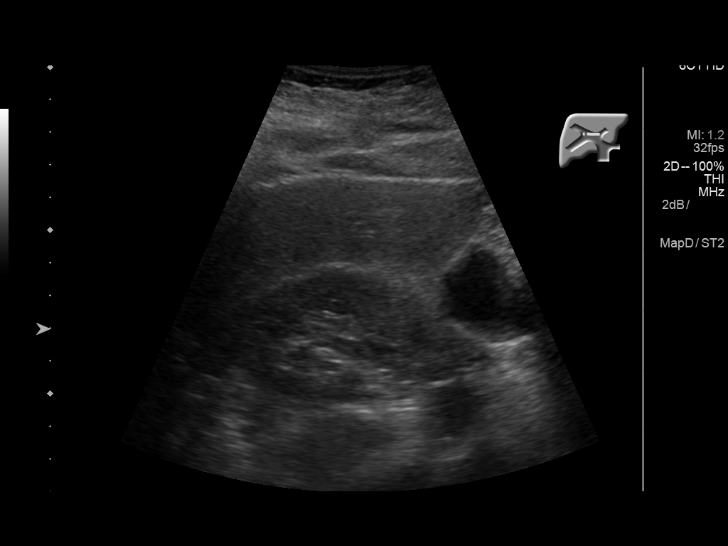

[Series 2001: us abdomen limited · 0.19mm/px · 1 of 1 slices shown (2 of 2)]
[im 1/1]
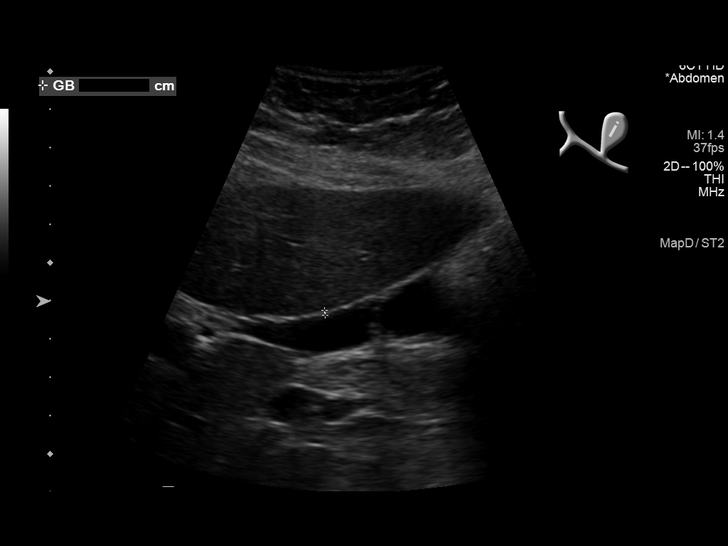

[14 of 25 positions shown; findings below may reference images not displayed]

FINDINGS: Gallbladder:

No gallstones or wall thickening visualized. No sonographic Murphy
sign noted by sonographer.

Common bile duct:

Diameter: 2.6 mm

Liver:

The hepatic echotexture is mildly increased. There is no focal mass
or ductal dilation. The surface contour of the liver is normal.
IMPRESSION: No gallstones or sonographic evidence of acute cholecystitis. If
there are clinical concerns of chronic cholecystitis, a nuclear
medicine hepatobiliary scan would be useful.

Mildly increased hepatic echotexture likely reflects fatty
infiltrative change.

## 2018-08-11 ENCOUNTER — Other Ambulatory Visit: Payer: Self-pay | Admitting: Internal Medicine

## 2018-08-11 DIAGNOSIS — Z1231 Encounter for screening mammogram for malignant neoplasm of breast: Secondary | ICD-10-CM

## 2018-10-19 ENCOUNTER — Other Ambulatory Visit: Payer: Self-pay

## 2018-10-19 ENCOUNTER — Ambulatory Visit
Admission: RE | Admit: 2018-10-19 | Discharge: 2018-10-19 | Disposition: A | Discharge status: Home / Self Care | Payer: Medicare Other | Source: Ambulatory Visit | Attending: Internal Medicine | Admitting: Internal Medicine

## 2018-10-19 DIAGNOSIS — Z1231 Encounter for screening mammogram for malignant neoplasm of breast: Secondary | ICD-10-CM | POA: Diagnosis not present

## 2019-04-13 DIAGNOSIS — I209 Angina pectoris, unspecified: Secondary | ICD-10-CM | POA: Diagnosis not present

## 2019-04-13 DIAGNOSIS — I509 Heart failure, unspecified: Secondary | ICD-10-CM | POA: Diagnosis not present

## 2019-04-13 DIAGNOSIS — K219 Gastro-esophageal reflux disease without esophagitis: Secondary | ICD-10-CM | POA: Diagnosis not present

## 2019-04-13 DIAGNOSIS — I059 Rheumatic mitral valve disease, unspecified: Secondary | ICD-10-CM | POA: Diagnosis not present

## 2019-04-20 DIAGNOSIS — I059 Rheumatic mitral valve disease, unspecified: Secondary | ICD-10-CM | POA: Diagnosis not present

## 2019-04-20 DIAGNOSIS — K219 Gastro-esophageal reflux disease without esophagitis: Secondary | ICD-10-CM | POA: Diagnosis not present

## 2019-04-20 DIAGNOSIS — I509 Heart failure, unspecified: Secondary | ICD-10-CM | POA: Diagnosis not present

## 2019-04-20 DIAGNOSIS — I209 Angina pectoris, unspecified: Secondary | ICD-10-CM | POA: Diagnosis not present

## 2019-05-17 DIAGNOSIS — F411 Generalized anxiety disorder: Secondary | ICD-10-CM | POA: Diagnosis not present

## 2019-05-17 DIAGNOSIS — I509 Heart failure, unspecified: Secondary | ICD-10-CM | POA: Diagnosis not present

## 2019-05-17 DIAGNOSIS — I208 Other forms of angina pectoris: Secondary | ICD-10-CM | POA: Diagnosis not present

## 2019-05-17 DIAGNOSIS — I059 Rheumatic mitral valve disease, unspecified: Secondary | ICD-10-CM | POA: Diagnosis not present

## 2019-05-21 IMAGING — MG MM DIGITAL SCREENING BILAT W/ TOMO W/ CAD
8 series · 8 of 24 positions shown · non-contrast
Comparison: Previous exam(s).

CLINICAL DATA: Screening. History of benign LEFT breast excisional
biopsy.

EXAM:
DIGITAL SCREENING BILATERAL MAMMOGRAM WITH TOMO AND CAD

[L CC synth-2D]
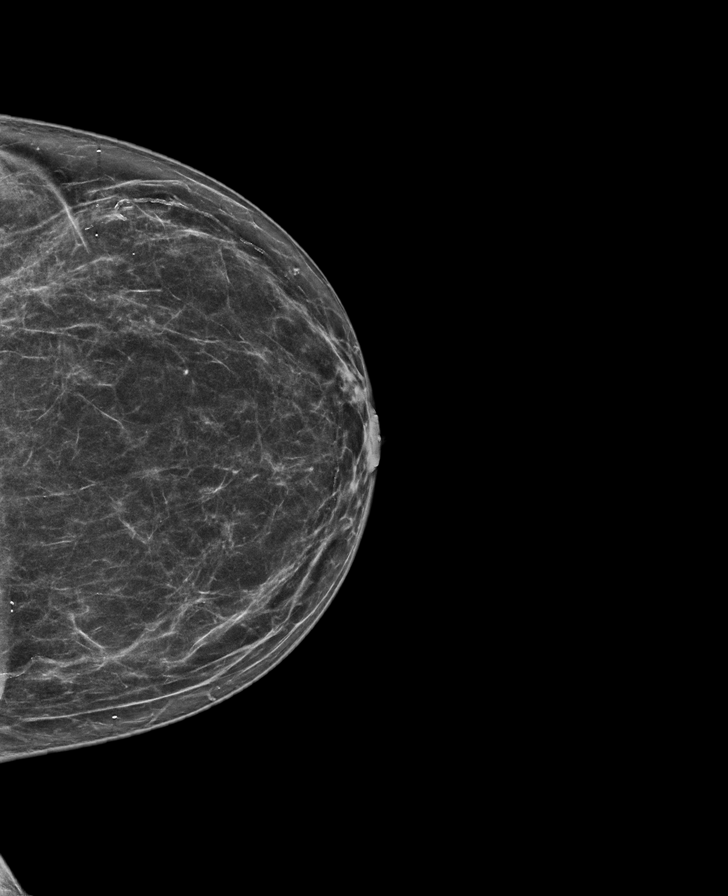

[R MLO synth-2D]
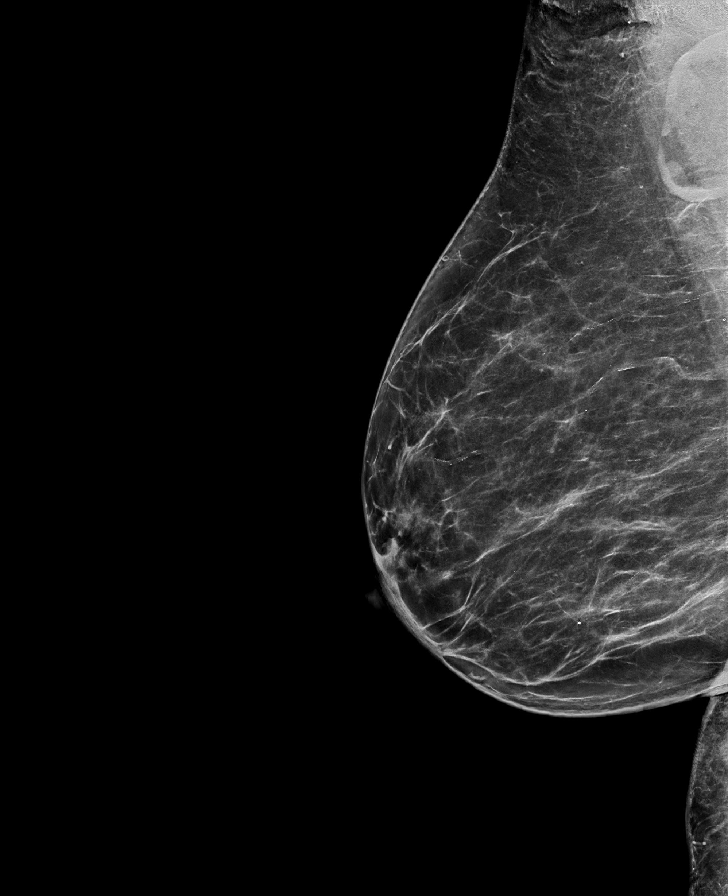

[L MLO synth-2D]
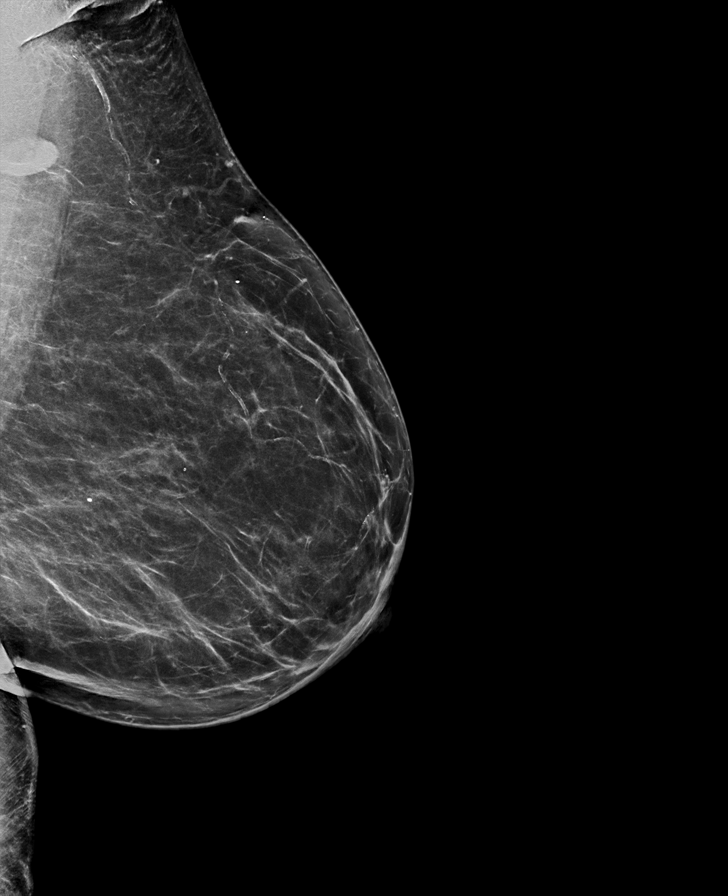

[R CC synth-2D]
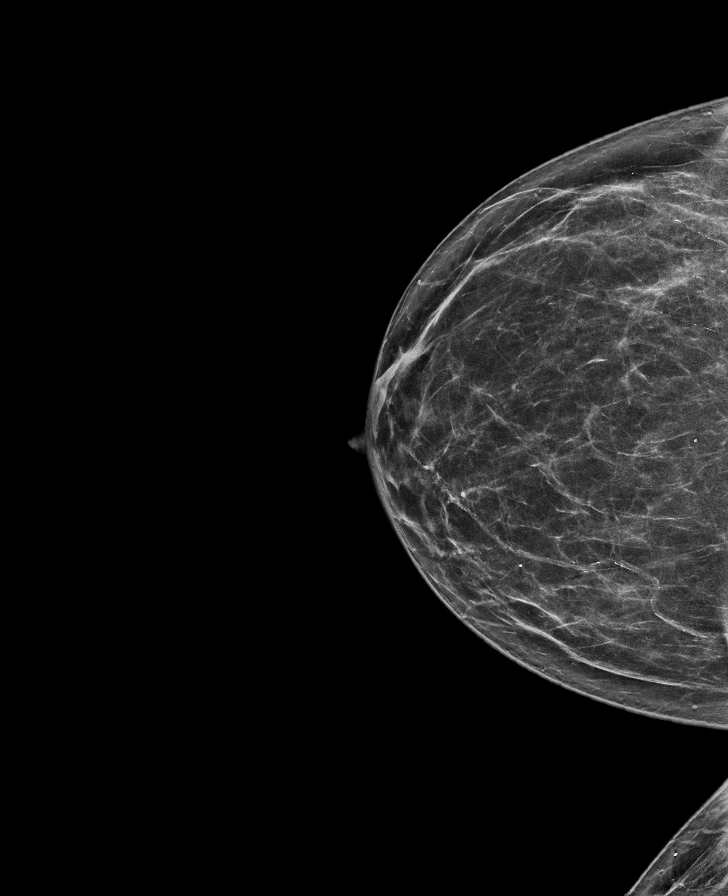

[L MLO tomo · tomo slice 40/79.0]
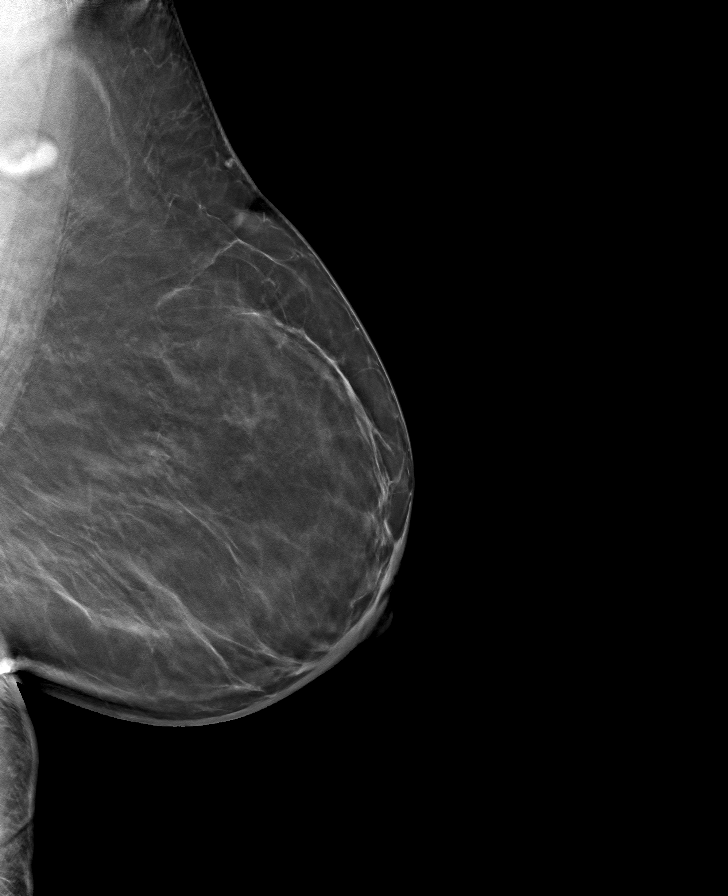

[L CC tomo · tomo slice 35/68.0]
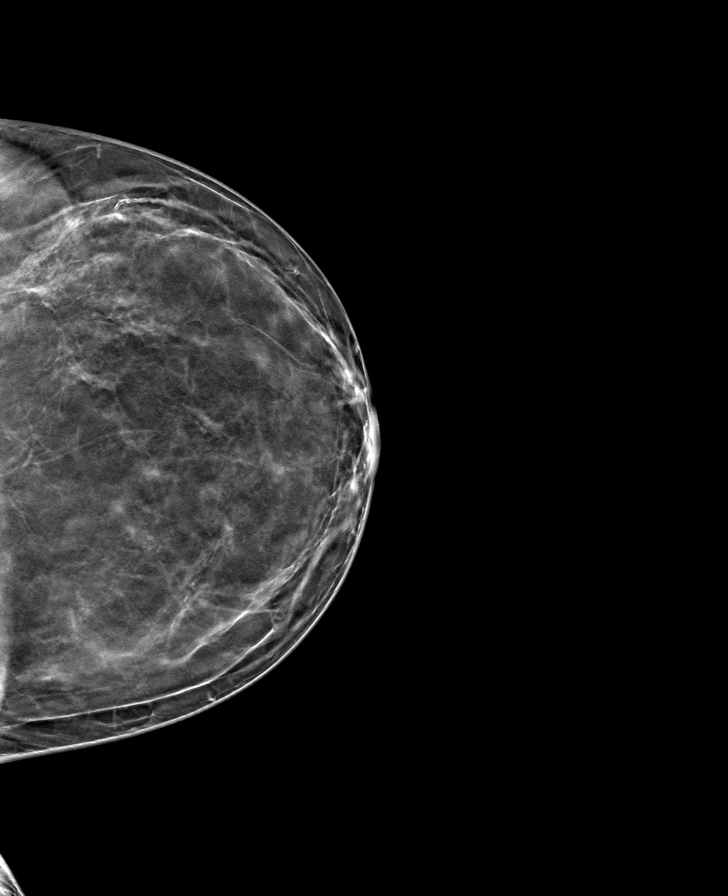

[R MLO tomo · tomo slice 39/77.0]
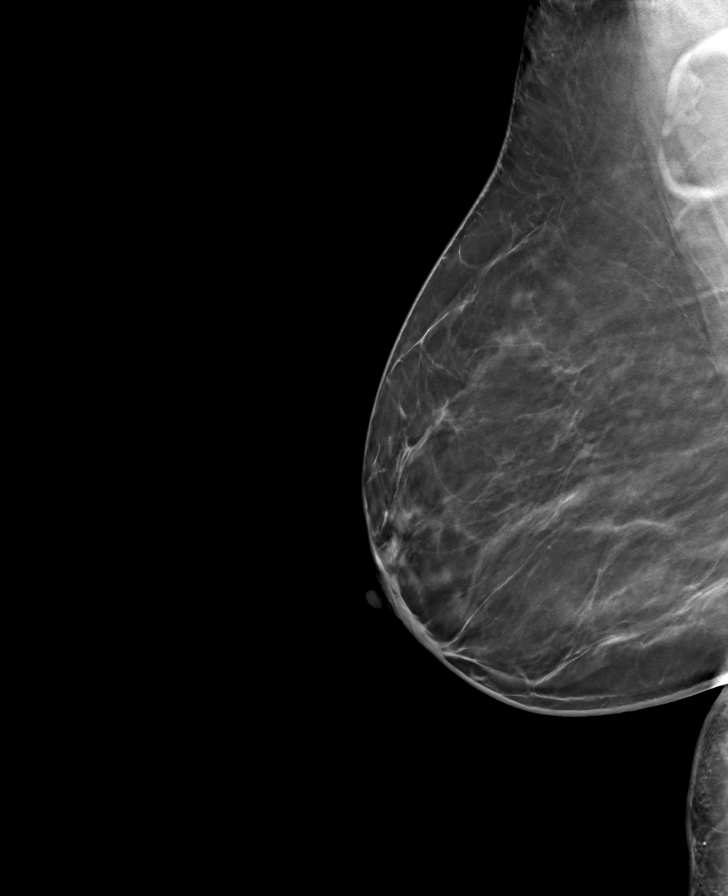

[R CC tomo · tomo slice 35/69.0]
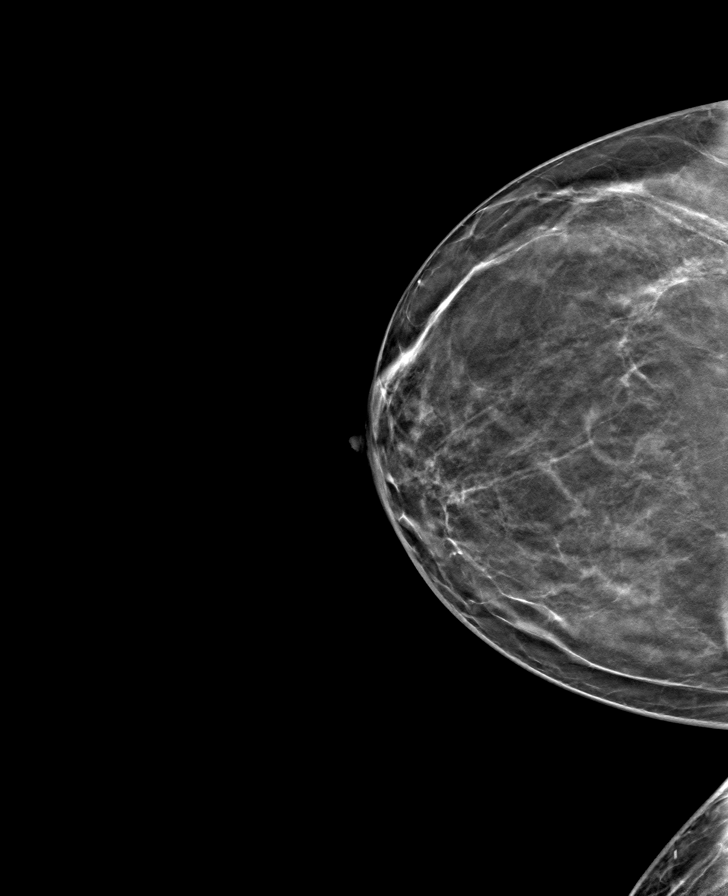

[8 of 24 positions shown; findings below may reference images not displayed]

ACR Breast Density Category b: There are scattered areas of
fibroglandular density.
FINDINGS: There are no findings suspicious for malignancy. Images were
processed with CAD.
IMPRESSION: No mammographic evidence of malignancy. A result letter of this
screening mammogram will be mailed directly to the patient.

RECOMMENDATION:
Screening mammogram in one year. (Code:PQ-G-YGU)

BI-RADS CATEGORY  1: Negative.

## 2019-06-01 DIAGNOSIS — K219 Gastro-esophageal reflux disease without esophagitis: Secondary | ICD-10-CM | POA: Diagnosis not present

## 2019-06-01 DIAGNOSIS — I059 Rheumatic mitral valve disease, unspecified: Secondary | ICD-10-CM | POA: Diagnosis not present

## 2019-06-28 DIAGNOSIS — F411 Generalized anxiety disorder: Secondary | ICD-10-CM | POA: Diagnosis not present

## 2019-06-28 DIAGNOSIS — I509 Heart failure, unspecified: Secondary | ICD-10-CM | POA: Diagnosis not present

## 2019-06-28 DIAGNOSIS — I208 Other forms of angina pectoris: Secondary | ICD-10-CM | POA: Diagnosis not present

## 2019-06-28 DIAGNOSIS — I10 Essential (primary) hypertension: Secondary | ICD-10-CM | POA: Diagnosis not present

## 2019-07-12 DIAGNOSIS — D649 Anemia, unspecified: Secondary | ICD-10-CM | POA: Diagnosis not present

## 2019-08-12 ENCOUNTER — Other Ambulatory Visit: Payer: Self-pay

## 2019-08-12 ENCOUNTER — Ambulatory Visit (INDEPENDENT_AMBULATORY_CARE_PROVIDER_SITE_OTHER): Payer: Medicare PPO | Admitting: Internal Medicine

## 2019-08-12 DIAGNOSIS — E538 Deficiency of other specified B group vitamins: Secondary | ICD-10-CM | POA: Diagnosis not present

## 2019-08-12 MED ORDER — CYANOCOBALAMIN 1000 MCG/ML IJ SOLN
1000.0000 ug | Freq: Once | INTRAMUSCULAR | Status: AC
  Administered 2019-08-12: 1000 ug via INTRAMUSCULAR

## 2019-08-12 NOTE — Progress Notes (Signed)
Patient came in for monthly B12 injection. Patient tolerated injection with no complications.  

## 2019-08-19 ENCOUNTER — Other Ambulatory Visit: Payer: Self-pay | Admitting: *Deleted

## 2019-08-19 MED ORDER — AMLODIPINE BESYLATE 5 MG PO TABS: 5.0000 mg | ORAL_TABLET | Freq: Every day | ORAL | 8 refills | Status: DC

## 2019-08-29 ENCOUNTER — Encounter: Payer: Self-pay | Admitting: Internal Medicine

## 2019-08-29 ENCOUNTER — Ambulatory Visit (INDEPENDENT_AMBULATORY_CARE_PROVIDER_SITE_OTHER): Payer: Medicare PPO | Admitting: Internal Medicine

## 2019-08-29 ENCOUNTER — Other Ambulatory Visit: Payer: Self-pay

## 2019-08-29 VITALS — BP 150/69 | HR 62 | Wt 164.9 lb

## 2019-08-29 DIAGNOSIS — J301 Allergic rhinitis due to pollen: Secondary | ICD-10-CM

## 2019-08-29 DIAGNOSIS — F411 Generalized anxiety disorder: Secondary | ICD-10-CM

## 2019-08-29 DIAGNOSIS — I25810 Atherosclerosis of coronary artery bypass graft(s) without angina pectoris: Secondary | ICD-10-CM | POA: Diagnosis not present

## 2019-08-29 DIAGNOSIS — F430 Acute stress reaction: Secondary | ICD-10-CM | POA: Insufficient documentation

## 2019-08-29 DIAGNOSIS — K219 Gastro-esophageal reflux disease without esophagitis: Secondary | ICD-10-CM | POA: Diagnosis not present

## 2019-08-29 DIAGNOSIS — I10 Essential (primary) hypertension: Secondary | ICD-10-CM | POA: Diagnosis not present

## 2019-08-29 MED ORDER — ALPRAZOLAM 0.25 MG PO TABS: 0.2500 mg | ORAL_TABLET | Freq: Every day | ORAL | 0 refills | Status: DC

## 2019-08-29 NOTE — Assessment & Plan Note (Signed)
Reflux is much better.

## 2019-08-29 NOTE — Assessment & Plan Note (Addendum)
Stable

## 2019-08-29 NOTE — Assessment & Plan Note (Signed)
No chest pain

## 2019-08-29 NOTE — Assessment & Plan Note (Signed)
Intermittent anxiety due to Covid istress.

## 2019-08-29 NOTE — Progress Notes (Signed)
Established Patient Office Visit  Subjective:  Patient ID: Natasha Murray, female    DOB: October 23, 1940  Age: 79 y.o. MRN: [id number]  CC:  Chief Complaint  Patient presents with  . Anxiety    Medication refill for Alprazolam    HPI  Natasha Murray presents for regular checkup.  She denies any history of chest pain.  There is no GI symptoms she is known to have coronary artery disease gastroesophageal reflux disease, history of hyperlipidemia and hypertension.  Her blood pressure is borderline elevated but she takes her medicine on a regular basisPatient has a history of heart attack .  There is no history of paroxysmal nocturnal dyspnea or orthopnea or swelling of the legs, no abdominal pain.  Past Medical History:  Diagnosis Date  . CAD (coronary artery disease)   . GERD (gastroesophageal reflux disease)   . HLD (hyperlipidemia)   . Hypertension   . MI, old 38  . Myocardial infarction ([place]) 10/14/2011    Past Surgical History:  Procedure Laterality Date  . ABDOMINAL HYSTERECTOMY    . BREAST EXCISIONAL BIOPSY Left    surgical bx neg ? late 1990's  . COLONOSCOPY WITH PROPOFOL N/A 03/13/2016   Procedure: COLONOSCOPY WITH PROPOFOL;  Surgeon: Lucilla Lame, MD;  Location: [facility];  Service: Endoscopy;  Laterality: N/A;  . CORONARY ARTERY BYPASS GRAFT  10/15/2011   5 vessel. [facility]  . ESOPHAGOGASTRODUODENOSCOPY (EGD) WITH PROPOFOL N/A 03/13/2016   Procedure: ESOPHAGOGASTRODUODENOSCOPY (EGD) WITH PROPOFOL;  Surgeon: Lucilla Lame, MD;  Location: [place];  Service: Endoscopy;  Laterality: N/A;  . POLYPECTOMY N/A 03/13/2016   Procedure: POLYPECTOMY;  Surgeon: Lucilla Lame, MD;  Location: [facility];  Service: Endoscopy;  Laterality: N/A;    Family History  Problem Relation Age of Onset  . Peripheral vascular disease Father   . Prostate cancer Brother   . CAD Mother   . Hypertension Mother   . Breast cancer Sister 40    Social History    Socioeconomic History  . Marital status: Widowed    Spouse name: Not on file  . Number of children: Not on file  . Years of education: Not on file  . Highest education level: Not on file  Occupational History  . Not on file  Tobacco Use  . Smoking status: Never Smoker  . Smokeless tobacco: Never Used  Substance and Sexual Activity  . Alcohol use: No  . Drug use: No  . Sexual activity: Not on file  Other Topics Concern  . Not on file  Social History Narrative  . Not on file   Social Determinants of Health   Financial Resource Strain:   . Difficulty of Paying Living Expenses:   Food Insecurity:   . Worried About Charity fundraiser in the Last Year:   . Arboriculturist in the Last Year:   Transportation Needs:   . Film/video editor (Medical):   Marland Kitchen Lack of Transportation (Non-Medical):   Physical Activity:   . Days of Exercise per Week:   . Minutes of Exercise per Session:   Stress:   . Feeling of Stress :   Social Connections:   . Frequency of Communication with Friends and Family:   . Frequency of Social Gatherings with Friends and Family:   . Attends Religious Services:   . Active Member of Clubs or Organizations:   . Attends Archivist Meetings:   Marland Kitchen Marital Status:   Intimate Production manager  Violence:   . Fear of Current or Ex-Partner:   . Emotionally Abused:   Marland Kitchen Physically Abused:   . Sexually Abused:      Current Outpatient Medications:  .  ALPRAZolam (XANAX) 0.25 MG tablet, Take 1 tablet (0.25 mg total) by mouth at bedtime., Disp: 30 tablet, Rfl: 0 .  amLODipine (NORVASC) 5 MG tablet, Take 1 tablet (5 mg total) by mouth daily., Disp: 30 tablet, Rfl: 8 .  aspirin EC 81 MG tablet, Take 81 mg by mouth daily., Disp: , Rfl:  .  hydrochlorothiazide (MICROZIDE) 12.5 MG capsule, Take 12.5 mg by mouth daily., Disp: , Rfl:  .  metoprolol (LOPRESSOR) 100 MG tablet, Take 100 mg by mouth 2 (two) times daily., Disp: , Rfl:  .  ondansetron (ZOFRAN ODT) 4 MG  disintegrating tablet, Take 1 tablet (4 mg total) by mouth every 8 (eight) hours as needed for nausea or vomiting., Disp: 20 tablet, Rfl: 0 .  pravastatin (PRAVACHOL) 20 MG tablet, Take 20 mg by mouth every evening., Disp: , Rfl:  .  valsartan (DIOVAN) 80 MG tablet, Take 80 mg by mouth 2 (two) times daily., Disp: , Rfl:  .  pantoprazole (PROTONIX) 20 MG tablet, Take 1 tablet (20 mg total) by mouth daily., Disp: 30 tablet, Rfl: 1   Allergies  Allergen Reactions  . Codeine Nausea And Vomiting    ROS Review of Systems  Constitutional: Positive for chills.  HENT: Negative for congestion.   Respiratory: Negative for chest tightness.   Cardiovascular: Negative for leg swelling.  Gastrointestinal: Negative for abdominal pain.  Genitourinary: Negative for flank pain.  Musculoskeletal: Negative for arthralgias.  Psychiatric/Behavioral: Negative for behavioral problems.      Objective:    Physical Exam  Constitutional: She appears well-developed.  HENT:  Head: Normocephalic.  Eyes: Pupils are equal, round, and reactive to light.  Neck: No JVD present. No tracheal deviation present. No thyromegaly present.  Cardiovascular: Normal rate, regular rhythm, normal heart sounds and intact distal pulses.  Pulmonary/Chest: She has no wheezes. She has no rales.  Abdominal: There is no abdominal tenderness. There is no guarding.  Musculoskeletal:        General: No edema.     Cervical back: Normal range of motion and neck supple.  Lymphadenopathy:    She has no cervical adenopathy.    BP (!) 150/69   Pulse 62   Wt 164 lb 14.4 oz (74.8 kg)   BMI 34.46 kg/m  Wt Readings from Last 3 Encounters:  08/29/19 164 lb 14.4 oz (74.8 kg)  04/16/16 164 lb (74.4 kg)  03/13/16 158 lb (71.7 kg)     Health Maintenance Due  Topic Date Due  . COVID-19 Vaccine (1) Never done  . TETANUS/TDAP  Never done  . DEXA SCAN  Never done  . PNA vac Low Risk Adult (1 of 2 - PCV13) Never done    There are no  preventive care reminders to display for this patient.  Lab Results  Component Value Date   TSH 2.584 04/09/2017   Lab Results  Component Value Date   WBC 4.5 04/09/2017   HGB 14.2 04/09/2017   HCT 40.4 04/09/2017   MCV 91.6 04/09/2017   PLT 244 04/09/2017   Lab Results  Component Value Date   NA 136 04/09/2017   K 2.9 (L) 04/09/2017   CO2 29 04/09/2017   GLUCOSE 117 (H) 04/09/2017   BUN 16 04/09/2017   CREATININE 1.12 (H) 04/09/2017   BILITOT  0.6 01/28/2016   ALKPHOS 90 01/28/2016   AST 24 01/28/2016   ALT 13 (L) 01/28/2016   PROT 9.0 (H) 01/28/2016   ALBUMIN 3.9 01/28/2016   CALCIUM 8.6 (L) 04/09/2017   ANIONGAP 10 04/09/2017   Lab Results  Component Value Date   CHOL 222 (H) 04/09/2017   Lab Results  Component Value Date   HDL 66 04/09/2017   Lab Results  Component Value Date   LDLCALC 119 (H) 04/09/2017   Lab Results  Component Value Date   TRIG 183 (H) 04/09/2017   Lab Results  Component Value Date   CHOLHDL 3.4 04/09/2017   No results found for: HGBA1C    Assessment & Plan:   Problem List Items Addressed This Visit      Cardiovascular and Mediastinum   HTN (hypertension)    Stable at the present time she was advised to lose weight.      Relevant Medications   valsartan (DIOVAN) 80 MG tablet   CAD (coronary artery disease)    No chest pain patient was advised to walk daily follow low-cholesterol diet blood sugar is stable at the present time.      Relevant Medications   valsartan (DIOVAN) 80 MG tablet     Respiratory   Seasonal allergic rhinitis due to pollen    Stable.        Digestive   GERD (gastroesophageal reflux disease)    Reflux is much better.        Other   Anxiety as acute reaction to exceptional stress - Primary    Intermittent anxiety due to Covid istress.      Relevant Medications   ALPRAZolam (XANAX) 0.25 MG tablet      Meds ordered this encounter  Medications  . ALPRAZolam (XANAX) 0.25 MG tablet     Sig: Take 1 tablet (0.25 mg total) by mouth at bedtime.    Dispense:  30 tablet    Refill:  0   1. Essential hypertension Borderline elevated.  Patient was advised to follow his diet and walk on a daily basis.  2. Gastroesophageal reflux disease without esophagitis Stable.  3. Coronary artery disease involving autologous vein coronary bypass graft without angina pectoris Stable.  4. Seasonal allergic rhinitis due to pollen Stable.  5. Anxiety as acute reaction to exceptional stress Intermittent Follow-up: Return in about 2 months (around 10/29/2019).    Cletis Athens, MD

## 2019-08-29 NOTE — Assessment & Plan Note (Signed)
Stable at the present time she was advised to lose weight.

## 2019-08-29 NOTE — Assessment & Plan Note (Signed)
No chest pain patient was advised to walk daily follow low-cholesterol diet blood sugar is stable at the present time.

## 2019-08-30 ENCOUNTER — Other Ambulatory Visit: Payer: Self-pay | Admitting: *Deleted

## 2019-08-30 MED ORDER — PANTOPRAZOLE SODIUM 20 MG PO TBEC: 20.0000 mg | DELAYED_RELEASE_TABLET | Freq: Every day | ORAL | 1 refills | Status: DC

## 2019-09-01 ENCOUNTER — Other Ambulatory Visit: Payer: Self-pay | Admitting: *Deleted

## 2019-09-01 MED ORDER — PANTOPRAZOLE SODIUM 20 MG PO TBEC: 20.0000 mg | DELAYED_RELEASE_TABLET | Freq: Every day | ORAL | 1 refills | Status: DC

## 2019-09-02 ENCOUNTER — Other Ambulatory Visit: Payer: Self-pay | Admitting: *Deleted

## 2019-09-02 MED ORDER — HYDROCHLOROTHIAZIDE 12.5 MG PO CAPS: 12.5000 mg | ORAL_CAPSULE | Freq: Every day | ORAL | 3 refills | Status: DC

## 2019-09-08 ENCOUNTER — Other Ambulatory Visit: Payer: Self-pay | Admitting: *Deleted

## 2019-09-08 MED ORDER — CYANOCOBALAMIN 1000 MCG/ML IJ SOLN: 500.0000 ug | INTRAMUSCULAR | 4 refills | Status: DC

## 2019-09-08 MED ORDER — HYDROCHLOROTHIAZIDE 12.5 MG PO CAPS: 12.5000 mg | ORAL_CAPSULE | Freq: Every day | ORAL | 3 refills | Status: DC

## 2019-09-12 ENCOUNTER — Other Ambulatory Visit: Payer: Self-pay | Admitting: Internal Medicine

## 2019-09-12 ENCOUNTER — Ambulatory Visit (INDEPENDENT_AMBULATORY_CARE_PROVIDER_SITE_OTHER): Payer: Medicare PPO | Admitting: Internal Medicine

## 2019-09-12 DIAGNOSIS — E538 Deficiency of other specified B group vitamins: Secondary | ICD-10-CM

## 2019-09-12 DIAGNOSIS — Z1231 Encounter for screening mammogram for malignant neoplasm of breast: Secondary | ICD-10-CM

## 2019-09-12 MED ORDER — CYANOCOBALAMIN 1000 MCG/ML IJ SOLN
1000.0000 ug | Freq: Once | INTRAMUSCULAR | Status: AC
  Administered 2019-09-12: 1000 ug via INTRAMUSCULAR

## 2019-09-12 NOTE — Progress Notes (Signed)
Patient came in for monthly B12 injection. Patient tolerated injection with no complications.

## 2019-09-29 ENCOUNTER — Other Ambulatory Visit: Payer: Self-pay | Admitting: Internal Medicine

## 2019-10-13 ENCOUNTER — Ambulatory Visit (INDEPENDENT_AMBULATORY_CARE_PROVIDER_SITE_OTHER): Payer: Medicare PPO

## 2019-10-13 ENCOUNTER — Other Ambulatory Visit: Payer: Self-pay

## 2019-10-13 DIAGNOSIS — E785 Hyperlipidemia, unspecified: Secondary | ICD-10-CM

## 2019-10-13 DIAGNOSIS — E538 Deficiency of other specified B group vitamins: Secondary | ICD-10-CM | POA: Diagnosis not present

## 2019-10-13 DIAGNOSIS — I10 Essential (primary) hypertension: Secondary | ICD-10-CM | POA: Diagnosis not present

## 2019-10-13 DIAGNOSIS — I25810 Atherosclerosis of coronary artery bypass graft(s) without angina pectoris: Secondary | ICD-10-CM | POA: Diagnosis not present

## 2019-10-13 NOTE — Addendum Note (Signed)
Addended by: Lacretia Nicks L on: 10/13/2019 12:03 PM   Modules accepted: Level of Service

## 2019-10-14 LAB — COMPLETE METABOLIC PANEL WITH GFR
AG Ratio: 0.8 (calc) — ABNORMAL LOW (ref 1.0–2.5)
ALT: 12 U/L (ref 6–29)
AST: 18 U/L (ref 10–35)
Albumin: 3.7 g/dL (ref 3.6–5.1)
Alkaline phosphatase (APISO): 87 U/L (ref 37–153)
BUN/Creatinine Ratio: 16 (calc) (ref 6–22)
BUN: 21 mg/dL (ref 7–25)
CO2: 28 mmol/L (ref 20–32)
Calcium: 9.3 mg/dL (ref 8.6–10.4)
Chloride: 100 mmol/L (ref 98–110)
Creat: 1.29 mg/dL — ABNORMAL HIGH (ref 0.60–0.93)
GFR, Est African American: 46 mL/min/{1.73_m2} — ABNORMAL LOW (ref >=60)
GFR, Est Non African American: 40 mL/min/{1.73_m2} — ABNORMAL LOW (ref >=60)
Globulin: 4.9 g/dL (calc) — ABNORMAL HIGH (ref 1.9–3.7)
Glucose, Bld: 97 mg/dL (ref 65–99)
Potassium: 3.8 mmol/L (ref 3.5–5.3)
Sodium: 138 mmol/L (ref 135–146)
Total Bilirubin: 0.4 mg/dL (ref 0.2–1.2)
Total Protein: 8.6 g/dL — ABNORMAL HIGH (ref 6.1–8.1)

## 2019-10-14 LAB — CBC WITH DIFFERENTIAL/PLATELET
Absolute Monocytes: 456 cells/uL (ref 200–950)
Basophils Absolute: 22 cells/uL (ref 0–200)
Basophils Relative: 0.5 %
Eosinophils Absolute: 142 cells/uL (ref 15–500)
Eosinophils Relative: 3.3 %
HCT: 36.1 % (ref 35.0–45.0)
Hemoglobin: 12.7 g/dL (ref 11.7–15.5)
Lymphs Abs: 1466 cells/uL (ref 850–3900)
MCH: 32.6 pg (ref 27.0–33.0)
MCHC: 35.2 g/dL (ref 32.0–36.0)
MCV: 92.8 fL (ref 80.0–100.0)
MPV: 10.8 fL (ref 7.5–12.5)
Monocytes Relative: 10.6 %
Neutro Abs: 2215 cells/uL (ref 1500–7800)
Neutrophils Relative %: 51.5 %
Platelets: 233 10*3/uL (ref 140–400)
RBC: 3.89 10*6/uL (ref 3.80–5.10)
RDW: 13.8 % (ref 11.0–15.0)
Total Lymphocyte: 34.1 %
WBC: 4.3 10*3/uL (ref 3.8–10.8)

## 2019-10-14 LAB — HEMOGLOBIN A1C
Hgb A1c MFr Bld: 5.8 % of total Hgb — ABNORMAL HIGH (ref <5.7)
Mean Plasma Glucose: 120 (calc)
eAG (mmol/L): 6.6 (calc)

## 2019-10-14 LAB — B12 AND FOLATE PANEL
Folate: 12 ng/mL
Vitamin B-12: 969 pg/mL (ref 200–1100)

## 2019-10-14 LAB — TSH: TSH: 1.63 mIU/L (ref 0.40–4.50)

## 2019-10-20 ENCOUNTER — Ambulatory Visit
Admission: RE | Admit: 2019-10-20 | Discharge: 2019-10-20 | Disposition: A | Discharge status: Home / Self Care | Payer: Medicare PPO | Source: Ambulatory Visit | Attending: Internal Medicine | Admitting: Internal Medicine

## 2019-10-20 DIAGNOSIS — Z1231 Encounter for screening mammogram for malignant neoplasm of breast: Secondary | ICD-10-CM | POA: Diagnosis not present

## 2019-10-25 ENCOUNTER — Other Ambulatory Visit: Payer: Self-pay | Admitting: Internal Medicine

## 2019-10-26 ENCOUNTER — Other Ambulatory Visit: Payer: Self-pay | Admitting: Internal Medicine

## 2019-10-26 ENCOUNTER — Other Ambulatory Visit: Payer: Self-pay | Admitting: *Deleted

## 2019-10-26 MED ORDER — VALSARTAN 80 MG PO TABS: 80.0000 mg | ORAL_TABLET | Freq: Two times a day (BID) | ORAL | 6 refills | Status: DC

## 2019-10-31 ENCOUNTER — Other Ambulatory Visit: Payer: Self-pay

## 2019-10-31 ENCOUNTER — Encounter: Payer: Self-pay | Admitting: Internal Medicine

## 2019-10-31 ENCOUNTER — Ambulatory Visit (INDEPENDENT_AMBULATORY_CARE_PROVIDER_SITE_OTHER): Payer: Medicare PPO | Admitting: Internal Medicine

## 2019-10-31 VITALS — BP 146/75 | HR 78 | Ht <= 58 in | Wt 166.5 lb

## 2019-10-31 DIAGNOSIS — F411 Generalized anxiety disorder: Secondary | ICD-10-CM | POA: Diagnosis not present

## 2019-10-31 DIAGNOSIS — F430 Acute stress reaction: Secondary | ICD-10-CM | POA: Diagnosis not present

## 2019-10-31 DIAGNOSIS — E669 Obesity, unspecified: Secondary | ICD-10-CM | POA: Diagnosis not present

## 2019-10-31 DIAGNOSIS — K219 Gastro-esophageal reflux disease without esophagitis: Secondary | ICD-10-CM

## 2019-10-31 DIAGNOSIS — I10 Essential (primary) hypertension: Secondary | ICD-10-CM | POA: Diagnosis not present

## 2019-10-31 MED ORDER — ALPRAZOLAM 0.25 MG PO TABS: 0.2500 mg | ORAL_TABLET | Freq: Two times a day (BID) | ORAL | 0 refills | Status: DC | PRN

## 2019-10-31 NOTE — Assessment & Plan Note (Signed)
-   I encouraged the patient to lose weight.  - I educated them on making healthy dietary choices including eating more fruits and vegetables and less fried foods. - I encouraged the patient to exercise more, and educated on the benefits of exercise including weight loss, diabetes management, and hypertension management.   

## 2019-10-31 NOTE — Assessment & Plan Note (Signed)
-   The patient's GERD is stable on medication.  - Instructed the patient to avoid eating spicy and acidic foods, as well as foods high in fat. - Instructed the patient to avoid eating large meals or meals 2-3 hours prior to sleeping. 

## 2019-10-31 NOTE — Assessment & Plan Note (Signed)
-   Patient experiencing high levels of anxiety.  - Encouraged patient to engage in relaxing activities like yoga, meditation, journaling, going for a walk, or participating in a hobby.  - Encouraged patient to reach out to trusted friends or family members about recent struggles 

## 2019-10-31 NOTE — Assessment & Plan Note (Signed)
-   Today, the patient's blood pressure is well managed on betablockers and ARBS. - The patient will continue the current treatment regimen.  - I encouraged the patient to eat a low-sodium diet to help control blood pressure. - I encouraged the patient to live an active lifestyle and complete activities that increases heart rate to 85% target heart rate at least 5 times per week for one hour.

## 2019-10-31 NOTE — Progress Notes (Signed)
Established Patient Office Visit  Subjective:  Patient ID: Natasha Murray, female    DOB: [birth year]  Age: 79 y.o. MRN: [id number]  CC:  Chief Complaint  Patient presents with  . Anxiety    xanax refill     Anxiety Presents for follow-up visit. Symptoms include irritability and muscle tension. Patient reports no confusion, dizziness, feeling of choking, hyperventilation, obsessions, panic, restlessness, shortness of breath or suicidal ideas. Symptoms occur most days. The severity of symptoms is mild. The quality of sleep is fair. Nighttime awakenings: occasional.   Compliance with medications is 51-75%.    Natasha Murray presents for check up  Past Medical History:  Diagnosis Date  . CAD (coronary artery disease)   . GERD (gastroesophageal reflux disease)   . HLD (hyperlipidemia)   . Hypertension   . MI, old 14  . Myocardial infarction ([facility] [facility]) 10/14/2011    Past Surgical History:  Procedure Laterality Date  . ABDOMINAL HYSTERECTOMY    . BREAST EXCISIONAL BIOPSY Left    surgical bx neg ? late 1990's  . COLONOSCOPY WITH PROPOFOL N/A 03/13/2016   Procedure: COLONOSCOPY WITH PROPOFOL;  Surgeon: Lucilla Lame, MD;  Location: [facility];  Service: Endoscopy;  Laterality: N/A;  . CORONARY ARTERY BYPASS GRAFT  10/15/2011   5 vessel. [facility]  . ESOPHAGOGASTRODUODENOSCOPY (EGD) WITH PROPOFOL N/A 03/13/2016   Procedure: ESOPHAGOGASTRODUODENOSCOPY (EGD) WITH PROPOFOL;  Surgeon: Lucilla Lame, MD;  Location: [facility];  Service: Endoscopy;  Laterality: N/A;  . POLYPECTOMY N/A 03/13/2016   Procedure: POLYPECTOMY;  Surgeon: Lucilla Lame, MD;  Location: [facility];  Service: Endoscopy;  Laterality: N/A;    Family History  Problem Relation Age of Onset  . Peripheral vascular disease Father   . Prostate cancer Brother   . CAD Mother   . Hypertension Mother   . Breast cancer Sister 52    Social History   Socioeconomic History  . Marital status: Widowed     Spouse name: Not on file  . Number of children: Not on file  . Years of education: Not on file  . Highest education level: Not on file  Occupational History  . Not on file  Tobacco Use  . Smoking status: Never Smoker  . Smokeless tobacco: Never Used  Substance and Sexual Activity  . Alcohol use: No  . Drug use: No  . Sexual activity: Not on file  Other Topics Concern  . Not on file  Social History Narrative  . Not on file   Social Determinants of Health   Financial Resource Strain:   . Difficulty of Paying Living Expenses:   Food Insecurity:   . Worried About Charity fundraiser in the Last Year:   . Arboriculturist in the Last Year:   Transportation Needs:   . Film/video editor (Medical):   Marland Kitchen Lack of Transportation (Non-Medical):   Physical Activity:   . Days of Exercise per Week:   . Minutes of Exercise per Session:   Stress:   . Feeling of Stress :   Social Connections:   . Frequency of Communication with Friends and Family:   . Frequency of Social Gatherings with Friends and Family:   . Attends Religious Services:   . Active Member of Clubs or Organizations:   . Attends Archivist Meetings:   Marland Kitchen Marital Status:   Intimate Partner Violence:   . Fear of Current or Ex-Partner:   . Emotionally Abused:   .  Physically Abused:   . Sexually Abused:      Current Outpatient Medications:  .  ALPRAZolam (XANAX) 0.25 MG tablet, Take 1 tablet (0.25 mg total) by mouth at bedtime., Disp: 30 tablet, Rfl: 0 .  amLODipine (NORVASC) 5 MG tablet, Take 1 tablet (5 mg total) by mouth daily., Disp: 30 tablet, Rfl: 8 .  aspirin EC 81 MG tablet, Take 81 mg by mouth daily., Disp: , Rfl:  .  cyanocobalamin (,VITAMIN B-12,) 1000 MCG/ML injection, INJECT 0.5 MLS (500 MCG TOTAL) INTO THE MUSCLE EVERY 30 (THIRTY) DAYS., Disp: 3 mL, Rfl: 1 .  hydrochlorothiazide (MICROZIDE) 12.5 MG capsule, Take 1 capsule (12.5 mg total) by mouth daily., Disp: 90 capsule, Rfl: 3 .  metoprolol  (LOPRESSOR) 100 MG tablet, Take 100 mg by mouth 2 (two) times daily., Disp: , Rfl:  .  ondansetron (ZOFRAN ODT) 4 MG disintegrating tablet, Take 1 tablet (4 mg total) by mouth every 8 (eight) hours as needed for nausea or vomiting., Disp: 20 tablet, Rfl: 0 .  pantoprazole (PROTONIX) 20 MG tablet, TAKE 1 TABLET BY MOUTH EVERY DAY, Disp: 30 tablet, Rfl: 1 .  pravastatin (PRAVACHOL) 20 MG tablet, Take 20 mg by mouth every evening., Disp: , Rfl:  .  valsartan (DIOVAN) 80 MG tablet, Take 1 tablet (80 mg total) by mouth 2 (two) times daily., Disp: 60 tablet, Rfl: 6 .  ALPRAZolam (XANAX) 0.25 MG tablet, Take 1 tablet (0.25 mg total) by mouth 2 (two) times daily as needed for anxiety., Disp: 20 tablet, Rfl: 0   Allergies  Allergen Reactions  . Codeine Nausea And Vomiting    ROS Review of Systems  Constitutional: Positive for irritability.  HENT: Negative.  Negative for facial swelling, nosebleeds and sinus pressure.   Eyes: Negative.   Respiratory: Negative.  Negative for shortness of breath.   Cardiovascular: Negative.   Gastrointestinal: Negative.   Endocrine: Negative.   Genitourinary: Negative.   Musculoskeletal: Negative.   Skin: Negative.   Allergic/Immunologic: Negative.   Neurological: Positive for seizures. Negative for dizziness.  Hematological: Negative.   Psychiatric/Behavioral: Positive for self-injury. Negative for agitation, confusion and suicidal ideas.  All other systems reviewed and are negative.     Objective:    Physical Exam Vitals reviewed.  Constitutional:      Appearance: Normal appearance.  HENT:     Head: Normocephalic.     Nose: Nose normal.     Mouth/Throat:     Mouth: Mucous membranes are moist.  Eyes:     Pupils: Pupils are equal, round, and reactive to light.  Neck:     Vascular: No carotid bruit.  Cardiovascular:     Rate and Rhythm: Normal rate and regular rhythm.     Pulses: Normal pulses.     Heart sounds: Normal heart sounds.  Pulmonary:       Effort: Pulmonary effort is normal.     Breath sounds: Normal breath sounds.  Abdominal:     General: Bowel sounds are normal.     Palpations: Abdomen is soft. There is no hepatomegaly, splenomegaly or mass.     Tenderness: There is no abdominal tenderness.     Hernia: No hernia is present.  Musculoskeletal:        General: No tenderness.     Cervical back: Neck supple.     Right lower leg: No edema.     Left lower leg: No edema.  Skin:    Findings: No rash.  Neurological:  Mental Status: She is alert and oriented to person, place, and time.     Motor: No weakness.  Psychiatric:        Mood and Affect: Mood and affect normal.        Behavior: Behavior normal.     BP (!) 146/75   Pulse 78   Ht 4\' 10"  (1.473 m)   Wt 166 lb 8 oz (75.5 kg)   BMI 34.80 kg/m  Wt Readings from Last 3 Encounters:  10/31/19 166 lb 8 oz (75.5 kg)  08/29/19 164 lb 14.4 oz (74.8 kg)  04/16/16 164 lb (74.4 kg)     Health Maintenance Due  Topic Date Due  . Hepatitis C Screening  Never done  . COVID-19 Vaccine (1) Never done  . TETANUS/TDAP  Never done  . DEXA SCAN  Never done  . PNA vac Low Risk Adult (1 of 2 - PCV13) Never done    There are no preventive care reminders to display for this patient.  Lab Results  Component Value Date   TSH 1.63 10/13/2019   Lab Results  Component Value Date   WBC 4.3 10/13/2019   HGB 12.7 10/13/2019   HCT 36.1 10/13/2019   MCV 92.8 10/13/2019   PLT 233 10/13/2019   Lab Results  Component Value Date   NA 138 10/13/2019   K 3.8 10/13/2019   CO2 28 10/13/2019   GLUCOSE 97 10/13/2019   BUN 21 10/13/2019   CREATININE 1.29 (H) 10/13/2019   BILITOT 0.4 10/13/2019   ALKPHOS 90 01/28/2016   AST 18 10/13/2019   ALT 12 10/13/2019   PROT 8.6 (H) 10/13/2019   ALBUMIN 3.9 01/28/2016   CALCIUM 9.3 10/13/2019   ANIONGAP 10 04/09/2017   Lab Results  Component Value Date   CHOL 222 (H) 04/09/2017   Lab Results  Component Value Date   HDL 66  04/09/2017   Lab Results  Component Value Date   LDLCALC 119 (H) 04/09/2017   Lab Results  Component Value Date   TRIG 183 (H) 04/09/2017   Lab Results  Component Value Date   CHOLHDL 3.4 04/09/2017   Lab Results  Component Value Date   HGBA1C 5.8 (H) 10/13/2019      Assessment & Plan:   Problem List Items Addressed This Visit      Cardiovascular and Mediastinum   HTN (hypertension)    - Today, the patient's blood pressure is well managed on betablockers and ARBS. - The patient will continue the current treatment regimen.  - I encouraged the patient to eat a low-sodium diet to help control blood pressure. - I encouraged the patient to live an active lifestyle and complete activities that increases heart rate to 85% target heart rate at least 5 times per week for one hour.            Digestive   Gastroesophageal reflux disease without esophagitis    - The patient's GERD is stable on medication.  - Instructed the patient to avoid eating spicy and acidic foods, as well as foods high in fat. - Instructed the patient to avoid eating large meals or meals 2-3 hours prior to sleeping.         Other   Anxiety as acute reaction to exceptional stress - Primary    - Patient experiencing high levels of anxiety.  - Encouraged patient to engage in relaxing activities like yoga, meditation, journaling, going for a walk, or participating in a hobby.  -  Encouraged patient to reach out to trusted friends or family members about recent struggles       Relevant Medications   ALPRAZolam (XANAX) 0.25 MG tablet   Obesity (BMI 30.0-34.9)    - I encouraged the patient to lose weight.  - I educated them on making healthy dietary choices including eating more fruits and vegetables and less fried foods. - I encouraged the patient to exercise more, and educated on the benefits of exercise including weight loss, diabetes management, and hypertension management.           Meds  ordered this encounter  Medications  . ALPRAZolam (XANAX) 0.25 MG tablet    Sig: Take 1 tablet (0.25 mg total) by mouth 2 (two) times daily as needed for anxiety.    Dispense:  20 tablet    Refill:  0    Follow-up: Return in about 4 weeks (around 11/28/2019).    Cletis Athens, MD

## 2019-11-15 DIAGNOSIS — M109 Gout, unspecified: Secondary | ICD-10-CM | POA: Diagnosis not present

## 2019-11-15 DIAGNOSIS — M79671 Pain in right foot: Secondary | ICD-10-CM | POA: Diagnosis not present

## 2019-11-19 ENCOUNTER — Other Ambulatory Visit: Payer: Self-pay | Admitting: Internal Medicine

## 2019-11-24 ENCOUNTER — Ambulatory Visit (INDEPENDENT_AMBULATORY_CARE_PROVIDER_SITE_OTHER): Payer: Medicare PPO | Admitting: Internal Medicine

## 2019-11-24 ENCOUNTER — Other Ambulatory Visit: Payer: Self-pay

## 2019-11-24 DIAGNOSIS — E785 Hyperlipidemia, unspecified: Secondary | ICD-10-CM

## 2019-11-24 DIAGNOSIS — E669 Obesity, unspecified: Secondary | ICD-10-CM | POA: Diagnosis not present

## 2019-11-24 DIAGNOSIS — I10 Essential (primary) hypertension: Secondary | ICD-10-CM | POA: Diagnosis not present

## 2019-11-24 DIAGNOSIS — R531 Weakness: Secondary | ICD-10-CM | POA: Diagnosis not present

## 2019-11-24 NOTE — Addendum Note (Signed)
Addended by: Alois Cliche on: 11/24/2019 09:50 AM   Modules accepted: Orders

## 2019-11-25 ENCOUNTER — Other Ambulatory Visit: Payer: Self-pay | Admitting: *Deleted

## 2019-11-25 LAB — LIPID PANEL
Cholesterol: 159 mg/dL (ref <200)
HDL: 56 mg/dL (ref >=50)
LDL Cholesterol (Calc): 80 mg/dL (calc)
Non-HDL Cholesterol (Calc): 103 mg/dL (calc) (ref <130)
Total CHOL/HDL Ratio: 2.8 (calc) (ref <5.0)
Triglycerides: 132 mg/dL (ref <150)

## 2019-11-25 LAB — CBC WITH DIFFERENTIAL/PLATELET
Absolute Monocytes: 610 cells/uL (ref 200–950)
Basophils Absolute: 11 cells/uL (ref 0–200)
Basophils Relative: 0.2 %
Eosinophils Absolute: 188 cells/uL (ref 15–500)
Eosinophils Relative: 3.3 %
HCT: 36.8 % (ref 35.0–45.0)
Hemoglobin: 12.5 g/dL (ref 11.7–15.5)
Lymphs Abs: 2001 cells/uL (ref 850–3900)
MCH: 32.1 pg (ref 27.0–33.0)
MCHC: 34 g/dL (ref 32.0–36.0)
MCV: 94.4 fL (ref 80.0–100.0)
MPV: 10.7 fL (ref 7.5–12.5)
Monocytes Relative: 10.7 %
Neutro Abs: 2890 cells/uL (ref 1500–7800)
Neutrophils Relative %: 50.7 %
Platelets: 249 10*3/uL (ref 140–400)
RBC: 3.9 10*6/uL (ref 3.80–5.10)
RDW: 13.4 % (ref 11.0–15.0)
Total Lymphocyte: 35.1 %
WBC: 5.7 10*3/uL (ref 3.8–10.8)

## 2019-11-25 LAB — COMPLETE METABOLIC PANEL WITH GFR
AG Ratio: 1 (calc) (ref 1.0–2.5)
ALT: 23 U/L (ref 6–29)
AST: 18 U/L (ref 10–35)
Albumin: 3.8 g/dL (ref 3.6–5.1)
Alkaline phosphatase (APISO): 83 U/L (ref 37–153)
BUN/Creatinine Ratio: 26 (calc) — ABNORMAL HIGH (ref 6–22)
BUN: 39 mg/dL — ABNORMAL HIGH (ref 7–25)
CO2: 29 mmol/L (ref 20–32)
Calcium: 8.7 mg/dL (ref 8.6–10.4)
Chloride: 93 mmol/L — ABNORMAL LOW (ref 98–110)
Creat: 1.5 mg/dL — ABNORMAL HIGH (ref 0.60–0.93)
GFR, Est African American: 38 mL/min/{1.73_m2} — ABNORMAL LOW (ref >=60)
GFR, Est Non African American: 33 mL/min/{1.73_m2} — ABNORMAL LOW (ref >=60)
Globulin: 4 g/dL (calc) — ABNORMAL HIGH (ref 1.9–3.7)
Glucose, Bld: 92 mg/dL (ref 65–99)
Potassium: 3.9 mmol/L (ref 3.5–5.3)
Sodium: 132 mmol/L — ABNORMAL LOW (ref 135–146)
Total Bilirubin: 0.5 mg/dL (ref 0.2–1.2)
Total Protein: 7.8 g/dL (ref 6.1–8.1)

## 2019-11-25 LAB — TSH: TSH: 2.36 mIU/L (ref 0.40–4.50)

## 2019-11-25 MED ORDER — HYDROCHLOROTHIAZIDE 25 MG PO TABS: 25.0000 mg | ORAL_TABLET | Freq: Every day | ORAL | 3 refills | Status: DC

## 2019-11-25 MED ORDER — HYDROCHLOROTHIAZIDE 12.5 MG PO CAPS: 25.0000 mg | ORAL_CAPSULE | Freq: Every day | ORAL | 3 refills | Status: DC

## 2019-11-25 NOTE — Addendum Note (Signed)
Addended by: Alois Cliche on: 11/25/2019 10:49 AM   Modules accepted: Orders

## 2019-11-28 ENCOUNTER — Other Ambulatory Visit: Payer: Self-pay

## 2019-11-28 MED ORDER — AMLODIPINE BESYLATE 5 MG PO TABS
5.0000 mg | ORAL_TABLET | Freq: Two times a day (BID) | ORAL | 8 refills | Status: DC
Start: 2019-11-28 — End: 2020-06-15

## 2019-12-01 ENCOUNTER — Ambulatory Visit: Payer: Medicare PPO | Admitting: Internal Medicine

## 2019-12-01 DIAGNOSIS — M79671 Pain in right foot: Secondary | ICD-10-CM | POA: Diagnosis not present

## 2019-12-01 DIAGNOSIS — M109 Gout, unspecified: Secondary | ICD-10-CM | POA: Diagnosis not present

## 2019-12-08 ENCOUNTER — Ambulatory Visit (INDEPENDENT_AMBULATORY_CARE_PROVIDER_SITE_OTHER): Payer: Medicare PPO | Admitting: Internal Medicine

## 2019-12-08 ENCOUNTER — Encounter: Payer: Self-pay | Admitting: Internal Medicine

## 2019-12-08 ENCOUNTER — Other Ambulatory Visit: Payer: Self-pay

## 2019-12-08 VITALS — BP 145/72 | HR 64 | Ht <= 58 in | Wt 164.0 lb

## 2019-12-08 DIAGNOSIS — Z0000 Encounter for general adult medical examination without abnormal findings: Secondary | ICD-10-CM

## 2019-12-08 DIAGNOSIS — M1A3710 Chronic gout due to renal impairment, right ankle and foot, without tophus (tophi): Secondary | ICD-10-CM | POA: Diagnosis not present

## 2019-12-08 DIAGNOSIS — E669 Obesity, unspecified: Secondary | ICD-10-CM | POA: Diagnosis not present

## 2019-12-08 DIAGNOSIS — F430 Acute stress reaction: Secondary | ICD-10-CM

## 2019-12-08 DIAGNOSIS — F411 Generalized anxiety disorder: Secondary | ICD-10-CM

## 2019-12-08 DIAGNOSIS — Z23 Encounter for immunization: Secondary | ICD-10-CM | POA: Diagnosis not present

## 2019-12-08 DIAGNOSIS — E66811 Obesity, class 1: Secondary | ICD-10-CM

## 2019-12-08 DIAGNOSIS — E782 Mixed hyperlipidemia: Secondary | ICD-10-CM

## 2019-12-08 DIAGNOSIS — I10 Essential (primary) hypertension: Secondary | ICD-10-CM | POA: Diagnosis not present

## 2019-12-08 MED ORDER — ALPRAZOLAM 0.25 MG PO TABS: 0.2500 mg | ORAL_TABLET | Freq: Every day | ORAL | 0 refills | Status: DC

## 2019-12-08 MED ORDER — ALLOPURINOL 100 MG PO TABS: 100.0000 mg | ORAL_TABLET | Freq: Every day | ORAL | 6 refills | Status: DC

## 2019-12-08 NOTE — Assessment & Plan Note (Signed)
Pt takes Xanax to sleep at night on occasion.

## 2019-12-08 NOTE — Assessment & Plan Note (Signed)
-   I encouraged the patient to lose weight.  - I educated them on making healthy dietary choices including eating more fruits and vegetables and less fried foods. - I encouraged the patient to exercise more, and educated on the benefits of exercise including weight loss, diabetes management, and hypertension management.   

## 2019-12-08 NOTE — Assessment & Plan Note (Signed)
Pt received flu shot off site

## 2019-12-08 NOTE — Assessment & Plan Note (Signed)

## 2019-12-08 NOTE — Assessment & Plan Note (Signed)
Pt was started on allopurinol.

## 2019-12-08 NOTE — Assessment & Plan Note (Signed)
Pt is known to have CAD with a hx of bypass surgery. She takes her blood pressure and cholesterol medication regularly. She is unable to lose weight and was advised to cut down on her calories. She was also advised to walk daily. Her lab test was reviewed with her.

## 2019-12-08 NOTE — Assessment & Plan Note (Signed)
-   Today, the patient's blood pressure is well managed on betablocker and ARBs. - The patient will continue the current treatment regimen.  - I encouraged the patient to eat a low-sodium diet to help control blood pressure. - I encouraged the patient to live an active lifestyle and complete activities that increases heart rate to 85% target heart rate at least 5 times per week for one hour.

## 2019-12-08 NOTE — Progress Notes (Signed)
Established Patient Office Visit  SUBJECTIVE:  Subjective  Patient ID: Natasha Murray, female    DOB: Jul 10, 1940  Age: 79 y.o. MRN: [id number]  CC:  Chief Complaint  Patient presents with   Annual Exam    HPI Natasha Murray is a 79 y.o. female presenting today for an annual exam.   Labs were last completed on 11/24/2019. CBC was normal. CMP was normal except for BUN 39, Creat 1.50, GFRE AA 38, Sodium 132, Chloride 93, Globulin 4.0. Lipid panel was normal except for cholesterol 222, triglycerides 183. TSH 2.36.  She does complain of some gout in her right foot, but it is getting much better overall. She is followed by Dr. Vickki Muff in [place], who gave her a steroid to treat on 11/15/2019.   She does complain of some difficulty sleeping, which she takes Xanax to treat with relief.   She is vaccinated against COVID19. She did get her flu vaccine for this season.    Past Medical History:  Diagnosis Date   CAD (coronary artery disease)    GERD (gastroesophageal reflux disease)    HLD (hyperlipidemia)    Hypertension    MI, old 1995   Myocardial infarction ([place]) 10/14/2011    Past Surgical History:  Procedure Laterality Date   ABDOMINAL HYSTERECTOMY     BREAST EXCISIONAL BIOPSY Left    surgical bx neg ? late 1990's   COLONOSCOPY WITH PROPOFOL N/A 03/13/2016   Procedure: COLONOSCOPY WITH PROPOFOL;  Surgeon: Lucilla Lame, MD;  Location: [facility];  Service: Endoscopy;  Laterality: N/A;   CORONARY ARTERY BYPASS GRAFT  10/15/2011   5 vessel. [facility]   ESOPHAGOGASTRODUODENOSCOPY (EGD) WITH PROPOFOL N/A 03/13/2016   Procedure: ESOPHAGOGASTRODUODENOSCOPY (EGD) WITH PROPOFOL;  Surgeon: Lucilla Lame, MD;  Location: [facility];  Service: Endoscopy;  Laterality: N/A;   POLYPECTOMY N/A 03/13/2016   Procedure: POLYPECTOMY;  Surgeon: Lucilla Lame, MD;  Location: [facility];  Service: Endoscopy;  Laterality: N/A;    Family History  Problem Relation Age  of Onset   Peripheral vascular disease Father    Prostate cancer Brother    CAD Mother    Hypertension Mother    Breast cancer Sister 57    Social History   Socioeconomic History   Marital status: Widowed    Spouse name: Not on file   Number of children: Not on file   Years of education: Not on file   Highest education level: Not on file  Occupational History   Not on file  Tobacco Use   Smoking status: Never Smoker   Smokeless tobacco: Never Used  Substance and Sexual Activity   Alcohol use: No   Drug use: No   Sexual activity: Not on file  Other Topics Concern   Not on file  Social History Narrative   Not on file   Social Determinants of Health   Financial Resource Strain:    Difficulty of Paying Living Expenses: Not on file  Food Insecurity:    Worried About [place] in the Last Year: Not on file   Ran Out of Food in the Last Year: Not on file  Transportation Needs:    Lack of Transportation (Medical): Not on file   Lack of Transportation (Non-Medical): Not on file  Physical Activity:    Days of Exercise per Week: Not on file   Minutes of Exercise per Session: Not on file  Stress:    Feeling of Stress : Not on  file  Social Connections:    Frequency of Communication with Friends and Family: Not on file   Frequency of Social Gatherings with Friends and Family: Not on file   Attends Religious Services: Not on Electrical engineer or Organizations: Not on file   Attends Archivist Meetings: Not on file   Marital Status: Not on file  Intimate Partner Violence:    Fear of Current or Ex-Partner: Not on file   Emotionally Abused: Not on file   Physically Abused: Not on file   Sexually Abused: Not on file     Current Outpatient Medications:    ALPRAZolam (XANAX) 0.25 MG tablet, Take 1 tablet (0.25 mg total) by mouth at bedtime., Disp: 30 tablet, Rfl: 0   amLODipine (NORVASC) 5 MG tablet, Take 1  tablet (5 mg total) by mouth 2 (two) times daily., Disp: 30 tablet, Rfl: 8   aspirin EC 81 MG tablet, Take 81 mg by mouth daily., Disp: , Rfl:    cyanocobalamin (,VITAMIN B-12,) 1000 MCG/ML injection, INJECT 0.5 MLS (500 MCG TOTAL) INTO THE MUSCLE EVERY 30 (THIRTY) DAYS., Disp: 3 mL, Rfl: 1   hydrochlorothiazide (HYDRODIURIL) 25 MG tablet, Take 1 tablet (25 mg total) by mouth daily., Disp: 90 tablet, Rfl: 3   metoprolol (LOPRESSOR) 100 MG tablet, Take 100 mg by mouth 2 (two) times daily., Disp: , Rfl:    ondansetron (ZOFRAN ODT) 4 MG disintegrating tablet, Take 1 tablet (4 mg total) by mouth every 8 (eight) hours as needed for nausea or vomiting., Disp: 20 tablet, Rfl: 0   pantoprazole (PROTONIX) 20 MG tablet, TAKE 1 TABLET BY MOUTH EVERY DAY, Disp: 30 tablet, Rfl: 1   pravastatin (PRAVACHOL) 20 MG tablet, Take 20 mg by mouth every evening., Disp: , Rfl:    valsartan (DIOVAN) 80 MG tablet, Take 1 tablet (80 mg total) by mouth 2 (two) times daily., Disp: 60 tablet, Rfl: 6   allopurinol (ZYLOPRIM) 100 MG tablet, Take 1 tablet (100 mg total) by mouth daily., Disp: 30 tablet, Rfl: 6   Allergies  Allergen Reactions   Codeine Nausea And Vomiting    ROS Review of Systems  Constitutional: Negative.   HENT: Negative.   Eyes: Negative.   Respiratory: Negative.   Cardiovascular: Negative.   Gastrointestinal: Negative.   Endocrine: Negative.   Genitourinary: Negative.   Musculoskeletal: Negative.   Skin: Negative.   Allergic/Immunologic: Negative.   Neurological: Negative.   Hematological: Negative.   Psychiatric/Behavioral: Positive for sleep disturbance (anxiety). The patient is nervous/anxious.   All other systems reviewed and are negative.    OBJECTIVE:    Physical Exam Vitals reviewed.  Constitutional:      Appearance: Normal appearance.  HENT:     Mouth/Throat:     Mouth: Mucous membranes are moist.  Eyes:     Pupils: Pupils are equal, round, and reactive to light.    Neck:     Vascular: No carotid bruit.  Cardiovascular:     Rate and Rhythm: Normal rate and regular rhythm.     Pulses: Normal pulses.     Heart sounds: Normal heart sounds.  Pulmonary:     Effort: Pulmonary effort is normal.     Breath sounds: Normal breath sounds.  Abdominal:     General: Bowel sounds are normal.     Palpations: Abdomen is soft. There is no hepatomegaly, splenomegaly or mass.     Tenderness: There is no abdominal tenderness.  Hernia: No hernia is present.  Musculoskeletal:        General: No tenderness.     Cervical back: Neck supple.     Right lower leg: 1+ Edema present.     Left lower leg: 1+ Edema present.  Skin:    Findings: No rash.  Neurological:     Mental Status: She is alert and oriented to person, place, and time.     Motor: No weakness.  Psychiatric:        Mood and Affect: Mood and affect normal.        Behavior: Behavior normal.     BP (!) 145/72    Pulse 64    Ht 4\' 10"  (1.473 m)    Wt 164 lb (74.4 kg)    BMI 34.28 kg/m  Wt Readings from Last 3 Encounters:  12/08/19 164 lb (74.4 kg)  10/31/19 166 lb 8 oz (75.5 kg)  08/29/19 164 lb 14.4 oz (74.8 kg)    Health Maintenance Due  Topic Date Due   Hepatitis C Screening  Never done   COVID-19 Vaccine (1) Never done   TETANUS/TDAP  Never done   DEXA SCAN  Never done   PNA vac Low Risk Adult (1 of 2 - PCV13) Never done    There are no preventive care reminders to display for this patient.  CBC Latest Ref Rng & Units 11/24/2019 10/13/2019 04/09/2017  WBC 3.8 - 10.8 Thousand/uL 5.7 4.3 4.5  Hemoglobin 11.7 - 15.5 g/dL 12.5 12.7 14.2  Hematocrit 35 - 45 % 36.8 36.1 40.4  Platelets 140 - 400 Thousand/uL 249 233 244   CMP Latest Ref Rng & Units 11/24/2019 10/13/2019 04/09/2017  Glucose 65 - 99 mg/dL 92 97 117(H)  BUN 7 - 25 mg/dL 39(H) 21 16  Creatinine 0.60 - 0.93 mg/dL 1.50(H) 1.29(H) 1.12(H)  Sodium 135 - 146 mmol/L 132(L) 138 136  Potassium 3.5 - 5.3 mmol/L 3.9 3.8 2.9(L)  Chloride  98 - 110 mmol/L 93(L) 100 97(L)  CO2 20 - 32 mmol/L 29 28 29   Calcium 8.6 - 10.4 mg/dL 8.7 9.3 8.6(L)  Total Protein 6.1 - 8.1 g/dL 7.8 8.6(H) -  Total Bilirubin 0.2 - 1.2 mg/dL 0.5 0.4 -  Alkaline Phos 38 - 126 U/L - - -  AST 10 - 35 U/L 18 18 -  ALT 6 - 29 U/L 23 12 -    Lab Results  Component Value Date   TSH 2.36 11/24/2019   Lab Results  Component Value Date   ALBUMIN 3.9 01/28/2016   ANIONGAP 10 04/09/2017   Lab Results  Component Value Date   CHOL 159 11/24/2019   CHOL 222 (H) 04/09/2017   CHOL 169 03/04/2016   HDL 56 11/24/2019   HDL 66 04/09/2017   HDL 64 03/04/2016   LDLCALC 80 11/24/2019   LDLCALC 119 (H) 04/09/2017   LDLCALC 79 03/04/2016   CHOLHDL 2.8 11/24/2019   CHOLHDL 3.4 04/09/2017   CHOLHDL 2.6 03/04/2016   Lab Results  Component Value Date   TRIG 132 11/24/2019   Lab Results  Component Value Date   HGBA1C 5.8 (H) 10/13/2019      ASSESSMENT & PLAN:   Problem List Items Addressed This Visit      Cardiovascular and Mediastinum   HTN (hypertension)    - Today, the patient's blood pressure is well managed on betablocker and ARBs. - The patient will continue the current treatment regimen.  - I encouraged the patient to  eat a low-sodium diet to help control blood pressure. - I encouraged the patient to live an active lifestyle and complete activities that increases heart rate to 85% target heart rate at least 5 times per week for one hour.            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       Relevant Medications   allopurinol (ZYLOPRIM) 100 MG tablet     Other   Hyperlipidemia, unspecified    - The patient's hyperlipidemia is stable on statin. - The patient will continue the current treatment regimen.  - I encouraged the patient to eat more vegetables and whole wheat, and to avoid fatty foods like whole milk, hard cheese, egg  yolks, margarine, baked sweets, and fried foods.  - I encouraged the patient to live an active lifestyle and complete activities for 40 minutes at least three times per week.  - I instructed the patient to go to the ER if they begin having chest pain.        Annual physical exam    Pt is known to have CAD with a hx of bypass surgery. She takes her blood pressure and cholesterol medication regularly. She is unable to lose weight and was advised to cut down on her calories. She was also advised to walk daily. Her lab test was reviewed with her.       Anxiety as acute reaction to exceptional stress    Pt takes Xanax to sleep at night on occasion.       Relevant Medications   ALPRAZolam (XANAX) 0.25 MG tablet   Obesity (BMI 30.0-34.9)    - I encouraged the patient to lose weight.  - I educated them on making healthy dietary choices including eating more fruits and vegetables and less fried foods. - I encouraged the patient to exercise more, and educated on the benefits of exercise including weight loss, diabetes management, and hypertension management.        Need for influenza vaccination    Pt received flu shot off site      Relevant Orders   Flu Vaccine QUAD High Dose(Fluad) (Completed)      Meds ordered this encounter  Medications   ALPRAZolam (XANAX) 0.25 MG tablet    Sig: Take 1 tablet (0.25 mg total) by mouth at bedtime.    Dispense:  30 tablet    Refill:  0   allopurinol (ZYLOPRIM) 100 MG tablet    Sig: Take 1 tablet (100 mg total) by mouth daily.    Dispense:  30 tablet    Refill:  6    Follow-up: No follow-ups on file.    Dr. Jane Canary [facility] [address] [place], [place] [address]   By signing my name below, I, [employer], attest that this documentation has been prepared under the direction and in the presence of Cletis Athens, MD. Electronically Signed: Cletis Athens, MD 12/08/19, 10:09 AM   I personally performed the services  described in this documentation, which was SCRIBED in my presence. The recorded information has been reviewed and considered accurate. It has been edited as necessary during review. Cletis Athens, MD

## 2019-12-17 ENCOUNTER — Other Ambulatory Visit: Payer: Self-pay | Admitting: Internal Medicine

## 2020-01-12 ENCOUNTER — Other Ambulatory Visit: Payer: Self-pay | Admitting: Internal Medicine

## 2020-02-02 DIAGNOSIS — H26493 Other secondary cataract, bilateral: Secondary | ICD-10-CM | POA: Diagnosis not present

## 2020-02-07 ENCOUNTER — Other Ambulatory Visit: Payer: Self-pay | Admitting: *Deleted

## 2020-02-07 MED ORDER — COLCHICINE 0.6 MG PO TABS: 0.6000 mg | ORAL_TABLET | Freq: Two times a day (BID) | ORAL | 0 refills | Status: DC

## 2020-02-08 ENCOUNTER — Other Ambulatory Visit: Payer: Self-pay | Admitting: *Deleted

## 2020-02-09 ENCOUNTER — Other Ambulatory Visit: Payer: Self-pay | Admitting: Internal Medicine

## 2020-03-01 ENCOUNTER — Other Ambulatory Visit: Payer: Self-pay | Admitting: Internal Medicine

## 2020-03-06 ENCOUNTER — Other Ambulatory Visit: Payer: Self-pay | Admitting: Internal Medicine

## 2020-03-13 ENCOUNTER — Ambulatory Visit (INDEPENDENT_AMBULATORY_CARE_PROVIDER_SITE_OTHER): Payer: Medicare PPO | Admitting: Internal Medicine

## 2020-03-13 ENCOUNTER — Other Ambulatory Visit: Payer: Self-pay

## 2020-03-13 ENCOUNTER — Encounter: Payer: Self-pay | Admitting: Internal Medicine

## 2020-03-13 VITALS — BP 156/77 | HR 71 | Ht <= 58 in | Wt 164.3 lb

## 2020-03-13 DIAGNOSIS — E669 Obesity, unspecified: Secondary | ICD-10-CM

## 2020-03-13 DIAGNOSIS — F411 Generalized anxiety disorder: Secondary | ICD-10-CM | POA: Diagnosis not present

## 2020-03-13 DIAGNOSIS — F430 Acute stress reaction: Secondary | ICD-10-CM

## 2020-03-13 DIAGNOSIS — E782 Mixed hyperlipidemia: Secondary | ICD-10-CM | POA: Diagnosis not present

## 2020-03-13 DIAGNOSIS — I25810 Atherosclerosis of coronary artery bypass graft(s) without angina pectoris: Secondary | ICD-10-CM

## 2020-03-13 DIAGNOSIS — I10 Essential (primary) hypertension: Secondary | ICD-10-CM | POA: Diagnosis not present

## 2020-03-13 MED ORDER — ALPRAZOLAM 0.25 MG PO TABS: 0.2500 mg | ORAL_TABLET | Freq: Every day | ORAL | 0 refills | Status: DC

## 2020-03-13 NOTE — Assessment & Plan Note (Signed)
-   Patient experiencing high levels of anxiety.  - Encouraged patient to engage in relaxing activities like yoga, meditation, journaling, going for a walk, or participating in a hobby.  - Encouraged patient to reach out to trusted friends or family members about recent struggles 

## 2020-03-13 NOTE — Assessment & Plan Note (Signed)
-   Today, the patient's blood pressure is well managed on valsartan. - The patient will continue the current treatment regimen.  - I encouraged the patient to eat a low-sodium diet to help control blood pressure. - I encouraged the patient to live an active lifestyle and complete activities that increases heart rate to 85% target heart rate at least 5 times per week for one hour.

## 2020-03-13 NOTE — Assessment & Plan Note (Signed)
-   The patient's hyperlipidemia is stable on pravachol. - The patient will continue the current treatment regimen.  - I encouraged the patient to eat more vegetables and whole wheat, and to avoid fatty foods like whole milk, hard cheese, egg yolks, margarine, baked sweets, and fried foods.  - I encouraged the patient to live an active lifestyle and complete activities for 40 minutes at least three times per week.  - I instructed the patient to go to the ER if they begin having chest pain.

## 2020-03-13 NOTE — Progress Notes (Signed)
Established Patient Office Visit  Subjective:  Patient ID: Natasha Murray, female    DOB: 1941/03/13  Age: 79 y.o. MRN: [id number]  CC:  Chief Complaint  Patient presents with  . Hypertension  . Anxiety    HPI  Natasha Murray presents for check up  Past Medical History:  Diagnosis Date  . CAD (coronary artery disease)   . GERD (gastroesophageal reflux disease)   . HLD (hyperlipidemia)   . Hypertension   . MI, old 89  . Myocardial infarction ([place]) 10/14/2011    Past Surgical History:  Procedure Laterality Date  . ABDOMINAL HYSTERECTOMY    . BREAST EXCISIONAL BIOPSY Left    surgical bx neg ? late 1990's  . COLONOSCOPY WITH PROPOFOL N/A 03/13/2016   Procedure: COLONOSCOPY WITH PROPOFOL;  Surgeon: Lucilla Lame, MD;  Location: [facility];  Service: Endoscopy;  Laterality: N/A;  . CORONARY ARTERY BYPASS GRAFT  10/15/2011   5 vessel. [facility]  . ESOPHAGOGASTRODUODENOSCOPY (EGD) WITH PROPOFOL N/A 03/13/2016   Procedure: ESOPHAGOGASTRODUODENOSCOPY (EGD) WITH PROPOFOL;  Surgeon: Lucilla Lame, MD;  Location: [facility];  Service: Endoscopy;  Laterality: N/A;  . POLYPECTOMY N/A 03/13/2016   Procedure: POLYPECTOMY;  Surgeon: Lucilla Lame, MD;  Location: [facility];  Service: Endoscopy;  Laterality: N/A;    Family History  Problem Relation Age of Onset  . Peripheral vascular disease Father   . Prostate cancer Brother   . CAD Mother   . Hypertension Mother   . Breast cancer Sister 39    Social History   Socioeconomic History  . Marital status: Widowed    Spouse name: Not on file  . Number of children: Not on file  . Years of education: Not on file  . Highest education level: Not on file  Occupational History  . Not on file  Tobacco Use  . Smoking status: Never Smoker  . Smokeless tobacco: Never Used  Substance and Sexual Activity  . Alcohol use: No  . Drug use: No  . Sexual activity: Not on file  Other Topics Concern  . Not on file  Social  History Narrative  . Not on file   Social Determinants of Health   Financial Resource Strain:   . Difficulty of Paying Living Expenses: Not on file  Food Insecurity:   . Worried About [employer] fundraiser in the Last Year: Not on file  . Ran Out of Food in the Last Year: Not on file  Transportation Needs:   . Lack of Transportation (Medical): Not on file  . Lack of Transportation (Non-Medical): Not on file  Physical Activity:   . Days of Exercise per Week: Not on file  . Minutes of Exercise per Session: Not on file  Stress:   . Feeling of Stress : Not on file  Social Connections:   . Frequency of Communication with Friends and Family: Not on file  . Frequency of Social Gatherings with Friends and Family: Not on file  . Attends Religious Services: Not on file  . Active Member of Clubs or Organizations: Not on file  . Attends Archivist Meetings: Not on file  . Marital Status: Not on file  Intimate Partner Violence:   . Fear of Current or Ex-Partner: Not on file  . Emotionally Abused: Not on file  . Physically Abused: Not on file  . Sexually Abused: Not on file     Current Outpatient Medications:  .  allopurinol (ZYLOPRIM) 100 MG tablet,  Take 1 tablet (100 mg total) by mouth daily., Disp: 30 tablet, Rfl: 6 .  ALPRAZolam (XANAX) 0.25 MG tablet, Take 1 tablet (0.25 mg total) by mouth at bedtime., Disp: 30 tablet, Rfl: 0 .  amLODipine (NORVASC) 5 MG tablet, Take 1 tablet (5 mg total) by mouth 2 (two) times daily., Disp: 30 tablet, Rfl: 8 .  aspirin EC 81 MG tablet, Take 81 mg by mouth daily., Disp: , Rfl:  .  cyanocobalamin (,VITAMIN B-12,) 1000 MCG/ML injection, INJECT 0.5 MLS (500 MCG TOTAL) INTO THE MUSCLE EVERY 30 (THIRTY) DAYS., Disp: 3 mL, Rfl: 1 .  hydrochlorothiazide (HYDRODIURIL) 25 MG tablet, Take 1 tablet (25 mg total) by mouth daily., Disp: 90 tablet, Rfl: 3 .  metoprolol (LOPRESSOR) 100 MG tablet, Take 100 mg by mouth 2 (two) times daily., Disp: , Rfl:  .   MITIGARE 0.6 MG CAPS, TAKE 1 TABLET BY MOUTH 2 TIMES DAILY. (COVERS MITIGARE), Disp: 60 capsule, Rfl: 6 .  ondansetron (ZOFRAN ODT) 4 MG disintegrating tablet, Take 1 tablet (4 mg total) by mouth every 8 (eight) hours as needed for nausea or vomiting., Disp: 20 tablet, Rfl: 0 .  pantoprazole (PROTONIX) 20 MG tablet, TAKE 1 TABLET BY MOUTH EVERY DAY, Disp: 30 tablet, Rfl: 1 .  pravastatin (PRAVACHOL) 20 MG tablet, Take 20 mg by mouth every evening., Disp: , Rfl:  .  valsartan (DIOVAN) 80 MG tablet, Take 1 tablet (80 mg total) by mouth 2 (two) times daily., Disp: 60 tablet, Rfl: 6   Allergies  Allergen Reactions  . Codeine Nausea And Vomiting    ROS Review of Systems  Constitutional: Negative.   HENT: Negative.   Eyes: Negative.   Respiratory: Negative.   Cardiovascular: Negative.   Gastrointestinal: Negative.   Endocrine: Negative.   Genitourinary: Negative.   Musculoskeletal: Negative.   Skin: Negative.   Allergic/Immunologic: Negative.   Neurological: Negative.   Hematological: Negative.   Psychiatric/Behavioral: Negative.   All other systems reviewed and are negative.     Objective:    Physical Exam Vitals reviewed.  Constitutional:      Appearance: Normal appearance.  HENT:     Mouth/Throat:     Mouth: Mucous membranes are moist.  Eyes:     Pupils: Pupils are equal, round, and reactive to light.  Neck:     Vascular: No carotid bruit.  Cardiovascular:     Rate and Rhythm: Normal rate and regular rhythm.     Pulses: Normal pulses.     Heart sounds: Normal heart sounds.  Pulmonary:     Effort: Pulmonary effort is normal.     Breath sounds: Normal breath sounds.  Abdominal:     General: Bowel sounds are normal.     Palpations: Abdomen is soft. There is no hepatomegaly, splenomegaly or mass.     Tenderness: There is no abdominal tenderness.     Hernia: No hernia is present.  Musculoskeletal:        General: No tenderness.     Cervical back: Neck supple.      Right lower leg: No edema.     Left lower leg: No edema.  Skin:    Findings: No rash.  Neurological:     Mental Status: She is alert and oriented to person, place, and time.     Motor: No weakness.  Psychiatric:        Mood and Affect: Mood and affect normal.        Behavior: Behavior normal.  BP (!) 156/77   Pulse 71   Ht 4\' 10"  (1.473 m)   Wt 164 lb 4.8 oz (74.5 kg)   BMI 34.34 kg/m  Wt Readings from Last 3 Encounters:  03/13/20 164 lb 4.8 oz (74.5 kg)  12/08/19 164 lb (74.4 kg)  10/31/19 166 lb 8 oz (75.5 kg)     Health Maintenance Due  Topic Date Due  . Hepatitis C Screening  Never done  . COVID-19 Vaccine (1) Never done  . TETANUS/TDAP  Never done  . DEXA SCAN  Never done  . PNA vac Low Risk Adult (1 of 2 - PCV13) Never done    There are no preventive care reminders to display for this patient.  Lab Results  Component Value Date   TSH 2.36 11/24/2019   Lab Results  Component Value Date   WBC 5.7 11/24/2019   HGB 12.5 11/24/2019   HCT 36.8 11/24/2019   MCV 94.4 11/24/2019   PLT 249 11/24/2019   Lab Results  Component Value Date   NA 132 (L) 11/24/2019   K 3.9 11/24/2019   CO2 29 11/24/2019   GLUCOSE 92 11/24/2019   BUN 39 (H) 11/24/2019   CREATININE 1.50 (H) 11/24/2019   BILITOT 0.5 11/24/2019   ALKPHOS 90 01/28/2016   AST 18 11/24/2019   ALT 23 11/24/2019   PROT 7.8 11/24/2019   ALBUMIN 3.9 01/28/2016   CALCIUM 8.7 11/24/2019   ANIONGAP 10 04/09/2017   Lab Results  Component Value Date   CHOL 159 11/24/2019   Lab Results  Component Value Date   HDL 56 11/24/2019   Lab Results  Component Value Date   LDLCALC 80 11/24/2019   Lab Results  Component Value Date   TRIG 132 11/24/2019   Lab Results  Component Value Date   CHOLHDL 2.8 11/24/2019   Lab Results  Component Value Date   HGBA1C 5.8 (H) 10/13/2019      Assessment & Plan:   Problem List Items Addressed This Visit      Cardiovascular and Mediastinum   HTN  (hypertension) - Primary    - Today, the patient's blood pressure is well managed on valsartan. - The patient will continue the current treatment regimen.  - I encouraged the patient to eat a low-sodium diet to help control blood pressure. - I encouraged the patient to live an active lifestyle and complete activities that increases heart rate to 85% target heart rate at least 5 times per week for one hour.          CAD (coronary artery disease)    Patient denies any chest pain or shortness of breath.  She was not found to be wheezing.  There is no pedal edema.  Patient is taking her medicine on a regular basis.  She was advised to lose weight.        Other   Hyperlipidemia, unspecified    - The patient's hyperlipidemia is stable on pravachol. - The patient will continue the current treatment regimen.  - I encouraged the patient to eat more vegetables and whole wheat, and to avoid fatty foods like whole milk, hard cheese, egg yolks, margarine, baked sweets, and fried foods.  - I encouraged the patient to live an active lifestyle and complete activities for 40 minutes at least three times per week.  - I instructed the patient to go to the ER if they begin having chest pain.        Anxiety as acute reaction  to exceptional stress    - Patient experiencing high levels of anxiety.  - Encouraged patient to engage in relaxing activities like yoga, meditation, journaling, going for a walk, or participating in a hobby.  - Encouraged patient to reach out to trusted friends or family members about recent struggles       Relevant Medications   ALPRAZolam (XANAX) 0.25 MG tablet   Obesity (BMI 30.0-34.9)    - I encouraged the patient to lose weight.  - I educated them on making healthy dietary choices including eating more fruits and vegetables and less fried foods. - I encouraged the patient to exercise more, and educated on the benefits of exercise including weight loss, diabetes prevention,  and hypertension prevention.           Meds ordered this encounter  Medications  . ALPRAZolam (XANAX) 0.25 MG tablet    Sig: Take 1 tablet (0.25 mg total) by mouth at bedtime.    Dispense:  30 tablet    Refill:  0    Follow-up: No follow-ups on file.    Cletis Athens, MD

## 2020-03-13 NOTE — Assessment & Plan Note (Signed)
-   I encouraged the patient to lose weight.  - I educated them on making healthy dietary choices including eating more fruits and vegetables and less fried foods. - I encouraged the patient to exercise more, and educated on the benefits of exercise including weight loss, diabetes prevention, and hypertension prevention.   

## 2020-03-13 NOTE — Assessment & Plan Note (Signed)
Patient denies any chest pain or shortness of breath.  She was not found to be wheezing.  There is no pedal edema.  Patient is taking her medicine on a regular basis.  She was advised to lose weight.

## 2020-03-28 ENCOUNTER — Other Ambulatory Visit: Payer: Self-pay | Admitting: *Deleted

## 2020-03-28 MED ORDER — NITROGLYCERIN 0.4 MG SL SUBL: 0.4000 mg | SUBLINGUAL_TABLET | SUBLINGUAL | 3 refills | Status: AC | PRN

## 2020-03-29 ENCOUNTER — Other Ambulatory Visit: Payer: Self-pay | Admitting: Internal Medicine

## 2020-04-25 ENCOUNTER — Other Ambulatory Visit: Payer: Self-pay | Admitting: Internal Medicine

## 2020-05-17 ENCOUNTER — Other Ambulatory Visit: Payer: Self-pay | Admitting: Internal Medicine

## 2020-05-30 ENCOUNTER — Other Ambulatory Visit: Payer: Self-pay | Admitting: Internal Medicine

## 2020-05-31 ENCOUNTER — Other Ambulatory Visit: Payer: Self-pay | Admitting: *Deleted

## 2020-05-31 MED ORDER — PRAVASTATIN SODIUM 20 MG PO TABS
20.0000 mg | ORAL_TABLET | Freq: Every evening | ORAL | 3 refills | Status: DC
Start: 2020-05-31 — End: 2021-05-06

## 2020-06-11 ENCOUNTER — Encounter: Payer: Self-pay | Admitting: Internal Medicine

## 2020-06-11 ENCOUNTER — Ambulatory Visit (INDEPENDENT_AMBULATORY_CARE_PROVIDER_SITE_OTHER): Payer: Medicare PPO | Admitting: Internal Medicine

## 2020-06-11 ENCOUNTER — Other Ambulatory Visit: Payer: Self-pay

## 2020-06-11 VITALS — BP 134/84 | HR 74 | Ht <= 58 in | Wt 161.9 lb

## 2020-06-11 DIAGNOSIS — R112 Nausea with vomiting, unspecified: Secondary | ICD-10-CM | POA: Diagnosis not present

## 2020-06-11 DIAGNOSIS — I10 Essential (primary) hypertension: Secondary | ICD-10-CM

## 2020-06-11 DIAGNOSIS — E669 Obesity, unspecified: Secondary | ICD-10-CM

## 2020-06-11 DIAGNOSIS — F411 Generalized anxiety disorder: Secondary | ICD-10-CM | POA: Diagnosis not present

## 2020-06-11 DIAGNOSIS — E782 Mixed hyperlipidemia: Secondary | ICD-10-CM | POA: Diagnosis not present

## 2020-06-11 DIAGNOSIS — F430 Acute stress reaction: Secondary | ICD-10-CM | POA: Diagnosis not present

## 2020-06-11 DIAGNOSIS — J301 Allergic rhinitis due to pollen: Secondary | ICD-10-CM | POA: Diagnosis not present

## 2020-06-11 NOTE — Progress Notes (Signed)
Established Patient Office Visit  Subjective:  Patient ID: Natasha Murray, female    DOB: 04-Dec-1940  Age: 80 y.o. MRN: [id number]  CC:  Chief Complaint  Patient presents with  . Hypertension    HPI  Natasha Murray presents for checkup.  Past Medical History:  Diagnosis Date  . CAD (coronary artery disease)   . GERD (gastroesophageal reflux disease)   . HLD (hyperlipidemia)   . Hypertension   . MI, old 55  . Myocardial infarction ([place]) 10/14/2011    Past Surgical History:  Procedure Laterality Date  . ABDOMINAL HYSTERECTOMY    . BREAST EXCISIONAL BIOPSY Left    surgical bx neg ? late 1990's  . COLONOSCOPY WITH PROPOFOL N/A 03/13/2016   Procedure: COLONOSCOPY WITH PROPOFOL;  Surgeon: Lucilla Lame, MD;  Location: [place];  Service: Endoscopy;  Laterality: N/A;  . CORONARY ARTERY BYPASS GRAFT  10/15/2011   5 vessel. [facility]  . ESOPHAGOGASTRODUODENOSCOPY (EGD) WITH PROPOFOL N/A 03/13/2016   Procedure: ESOPHAGOGASTRODUODENOSCOPY (EGD) WITH PROPOFOL;  Surgeon: Lucilla Lame, MD;  Location: [place];  Service: Endoscopy;  Laterality: N/A;  . POLYPECTOMY N/A 03/13/2016   Procedure: POLYPECTOMY;  Surgeon: Lucilla Lame, MD;  Location: [place];  Service: Endoscopy;  Laterality: N/A;    Family History  Problem Relation Age of Onset  . Peripheral vascular disease Father   . Prostate cancer Brother   . CAD Mother   . Hypertension Mother   . Breast cancer Sister 65    Social History   Socioeconomic History  . Marital status: Widowed    Spouse name: Not on file  . Number of children: Not on file  . Years of education: Not on file  . Highest education level: Not on file  Occupational History  . Not on file  Tobacco Use  . Smoking status: Never Smoker  . Smokeless tobacco: Never Used  Substance and Sexual Activity  . Alcohol use: No  . Drug use: No  . Sexual activity: Not on file  Other Topics Concern  . Not on file  Social History  Narrative  . Not on file   Social Determinants of Health   Financial Resource Strain: Not on file  Food Insecurity: Not on file  Transportation Needs: Not on file  Physical Activity: Not on file  Stress: Not on file  Social Connections: Not on file  Intimate Partner Violence: Not on file     Current Outpatient Medications:  .  allopurinol (ZYLOPRIM) 100 MG tablet, Take 1 tablet (100 mg total) by mouth daily., Disp: 30 tablet, Rfl: 6 .  ALPRAZolam (XANAX) 0.25 MG tablet, Take 1 tablet (0.25 mg total) by mouth at bedtime., Disp: 30 tablet, Rfl: 0 .  amLODipine (NORVASC) 5 MG tablet, Take 1 tablet (5 mg total) by mouth 2 (two) times daily., Disp: 30 tablet, Rfl: 8 .  aspirin EC 81 MG tablet, Take 81 mg by mouth daily., Disp: , Rfl:  .  cyanocobalamin (,VITAMIN B-12,) 1000 MCG/ML injection, INJECT 0.5 MLS (500 MCG TOTAL) INTO THE MUSCLE EVERY 30 (THIRTY) DAYS., Disp: 3 mL, Rfl: 1 .  hydrochlorothiazide (HYDRODIURIL) 25 MG tablet, Take 1 tablet (25 mg total) by mouth daily., Disp: 90 tablet, Rfl: 3 .  metoprolol (LOPRESSOR) 100 MG tablet, Take 100 mg by mouth 2 (two) times daily., Disp: , Rfl:  .  MITIGARE 0.6 MG CAPS, TAKE 1 TABLET BY MOUTH 2 TIMES DAILY. (COVERS MITIGARE), Disp: 60 capsule, Rfl: 6 .  nitroGLYCERIN (NITROSTAT) 0.4 MG SL tablet, Place 1 tablet (0.4 mg total) under the tongue every 5 (five) minutes as needed for chest pain., Disp: 50 tablet, Rfl: 3 .  ondansetron (ZOFRAN ODT) 4 MG disintegrating tablet, Take 1 tablet (4 mg total) by mouth every 8 (eight) hours as needed for nausea or vomiting., Disp: 20 tablet, Rfl: 0 .  pantoprazole (PROTONIX) 20 MG tablet, TAKE 1 TABLET BY MOUTH EVERY DAY, Disp: 30 tablet, Rfl: 1 .  pravastatin (PRAVACHOL) 20 MG tablet, Take 1 tablet (20 mg total) by mouth every evening., Disp: 90 tablet, Rfl: 3 .  valsartan (DIOVAN) 80 MG tablet, TAKE 1 TABLET (80 MG TOTAL) BY MOUTH 2 (TWO) TIMES DAILY., Disp: 180 tablet, Rfl: 2   Allergies  Allergen  Reactions  . Codeine Nausea And Vomiting    ROS Review of Systems  Constitutional: Negative.   HENT: Negative.   Eyes: Negative.  Negative for pain and redness.  Respiratory: Negative.  Negative for cough and shortness of breath.   Cardiovascular: Negative.  Negative for leg swelling.  Gastrointestinal: Negative.  Negative for blood in stool.  Endocrine: Negative.   Genitourinary: Negative.  Negative for dysuria and flank pain.  Musculoskeletal: Negative.  Negative for back pain.  Skin: Negative.   Allergic/Immunologic: Negative.   Neurological: Negative.  Negative for headaches.  Hematological: Negative.   Psychiatric/Behavioral: Negative.  Negative for agitation, behavioral problems and dysphoric mood.  All other systems reviewed and are negative.     Objective:    Physical Exam Vitals reviewed.  Constitutional:      Appearance: Normal appearance.  HENT:     Mouth/Throat:     Mouth: Mucous membranes are moist.  Eyes:     Pupils: Pupils are equal, round, and reactive to light.  Neck:     Vascular: No carotid bruit.  Cardiovascular:     Rate and Rhythm: Normal rate and regular rhythm.     Pulses: Normal pulses.     Heart sounds: Normal heart sounds.  Pulmonary:     Effort: Pulmonary effort is normal.     Breath sounds: Normal breath sounds.  Abdominal:     General: Bowel sounds are normal.     Palpations: Abdomen is soft. There is no hepatomegaly, splenomegaly or mass.     Tenderness: There is no abdominal tenderness.     Hernia: No hernia is present.  Musculoskeletal:        General: No tenderness.     Cervical back: Neck supple.     Right lower leg: No edema.     Left lower leg: No edema.  Skin:    Findings: No rash.  Neurological:     Mental Status: She is alert and oriented to person, place, and time.     Motor: No weakness.  Psychiatric:        Mood and Affect: Mood and affect normal.        Behavior: Behavior normal.     BP 134/84   Pulse 74    Ht 4\' 10"  (1.473 m)   Wt 161 lb 14.4 oz (73.4 kg)   BMI 33.84 kg/m  Wt Readings from Last 3 Encounters:  06/11/20 161 lb 14.4 oz (73.4 kg)  03/13/20 164 lb 4.8 oz (74.5 kg)  12/08/19 164 lb (74.4 kg)     Health Maintenance Due  Topic Date Due  . Hepatitis C Screening  Never done  . COVID-19 Vaccine (1) Never done  . TETANUS/TDAP  Never  done  . DEXA SCAN  Never done  . PNA vac Low Risk Adult (1 of 2 - PCV13) Never done    There are no preventive care reminders to display for this patient.  Lab Results  Component Value Date   TSH 2.36 11/24/2019   Lab Results  Component Value Date   WBC 5.7 11/24/2019   HGB 12.5 11/24/2019   HCT 36.8 11/24/2019   MCV 94.4 11/24/2019   PLT 249 11/24/2019   Lab Results  Component Value Date   NA 132 (L) 11/24/2019   K 3.9 11/24/2019   CO2 29 11/24/2019   GLUCOSE 92 11/24/2019   BUN 39 (H) 11/24/2019   CREATININE 1.50 (H) 11/24/2019   BILITOT 0.5 11/24/2019   ALKPHOS 90 01/28/2016   AST 18 11/24/2019   ALT 23 11/24/2019   PROT 7.8 11/24/2019   ALBUMIN 3.9 01/28/2016   CALCIUM 8.7 11/24/2019   ANIONGAP 10 04/09/2017   Lab Results  Component Value Date   CHOL 159 11/24/2019   Lab Results  Component Value Date   HDL 56 11/24/2019   Lab Results  Component Value Date   LDLCALC 80 11/24/2019   Lab Results  Component Value Date   TRIG 132 11/24/2019   Lab Results  Component Value Date   CHOLHDL 2.8 11/24/2019   Lab Results  Component Value Date   HGBA1C 5.8 (H) 10/13/2019      Assessment & Plan:   Problem List Items Addressed This Visit      Cardiovascular and Mediastinum   Primary hypertension    Patient blood pressure is normal patient denies any chest pain or shortness of breath there is no history of palpitation paroxysmal nocturnal dyspnea patient can walk 1 blocks without any problem patient was advised to follow low-salt low-cholesterol diet        Respiratory   Seasonal allergic rhinitis due to  pollen    Take Claritin 5 to 10 mg p.o. daily        Digestive   Intractable nausea and vomiting     Other   Moderate mixed hyperlipidemia not requiring statin therapy    - The patient's hyperlipidemia is stable on statin. - The patient will continue the current treatment regimen.  - I encouraged the patient to eat more vegetables and whole wheat, and to avoid fatty foods like whole milk, hard cheese, egg yolks, margarine, baked sweets, and fried foods.  - I encouraged the patient to live an active lifestyle and complete activities for 40 minutes at least three times per week.  - I instructed the patient to go to the ER if they begin having chest pain.        Anxiety as acute reaction to exceptional stress - Primary    - Patient experiencing high levels of anxiety.  - Encouraged patient to engage in relaxing activities like yoga, meditation, journaling, going for a walk, or participating in a hobby.  - Encouraged patient to reach out to trusted friends or family members about recent struggles      Obesity (BMI 30.0-34.9)    - I encouraged the patient to lose weight.  - I educated them on making healthy dietary choices including eating more fruits and vegetables and less fried foods. - I encouraged the patient to exercise more, and educated on the benefits of exercise including weight loss, diabetes prevention, and hypertension prevention.           No orders of the defined types  were placed in this encounter.   Follow-up: No follow-ups on file.    Cletis Athens, MD

## 2020-06-11 NOTE — Assessment & Plan Note (Signed)
Patient blood pressure is normal patient denies any chest pain or shortness of breath there is no history of palpitation paroxysmal nocturnal dyspnea patient can walk 1 blocks without any problem patient was advised to follow low-salt low-cholesterol diet

## 2020-06-11 NOTE — Assessment & Plan Note (Signed)

## 2020-06-11 NOTE — Assessment & Plan Note (Signed)
-   Patient experiencing high levels of anxiety.  - Encouraged patient to engage in relaxing activities like yoga, meditation, journaling, going for a walk, or participating in a hobby.  - Encouraged patient to reach out to trusted friends or family members about recent struggles 

## 2020-06-11 NOTE — Assessment & Plan Note (Signed)
-   I encouraged the patient to lose weight.  - I educated them on making healthy dietary choices including eating more fruits and vegetables and less fried foods. - I encouraged the patient to exercise more, and educated on the benefits of exercise including weight loss, diabetes prevention, and hypertension prevention.   

## 2020-06-11 NOTE — Assessment & Plan Note (Signed)
Take Claritin 5 to 10 mg p.o. daily

## 2020-06-12 ENCOUNTER — Other Ambulatory Visit: Payer: Self-pay | Admitting: Internal Medicine

## 2020-06-15 ENCOUNTER — Other Ambulatory Visit: Payer: Self-pay | Admitting: Internal Medicine

## 2020-07-11 ENCOUNTER — Other Ambulatory Visit: Payer: Self-pay | Admitting: Internal Medicine

## 2020-08-07 ENCOUNTER — Other Ambulatory Visit: Payer: Self-pay | Admitting: Internal Medicine

## 2020-08-31 ENCOUNTER — Other Ambulatory Visit: Payer: Self-pay | Admitting: Internal Medicine

## 2020-09-10 ENCOUNTER — Other Ambulatory Visit: Payer: Self-pay | Admitting: Internal Medicine

## 2020-09-10 DIAGNOSIS — Z1231 Encounter for screening mammogram for malignant neoplasm of breast: Secondary | ICD-10-CM

## 2020-09-11 ENCOUNTER — Other Ambulatory Visit: Payer: Self-pay

## 2020-09-11 ENCOUNTER — Ambulatory Visit: Payer: Medicare PPO | Admitting: Internal Medicine

## 2020-09-11 ENCOUNTER — Encounter: Payer: Self-pay | Admitting: Internal Medicine

## 2020-09-11 VITALS — BP 130/85 | HR 57 | Ht <= 58 in | Wt 165.0 lb

## 2020-09-11 DIAGNOSIS — F411 Generalized anxiety disorder: Secondary | ICD-10-CM | POA: Diagnosis not present

## 2020-09-11 DIAGNOSIS — K219 Gastro-esophageal reflux disease without esophagitis: Secondary | ICD-10-CM

## 2020-09-11 DIAGNOSIS — I25810 Atherosclerosis of coronary artery bypass graft(s) without angina pectoris: Secondary | ICD-10-CM | POA: Diagnosis not present

## 2020-09-11 DIAGNOSIS — E669 Obesity, unspecified: Secondary | ICD-10-CM

## 2020-09-11 DIAGNOSIS — E782 Mixed hyperlipidemia: Secondary | ICD-10-CM

## 2020-09-11 DIAGNOSIS — I10 Essential (primary) hypertension: Secondary | ICD-10-CM | POA: Diagnosis not present

## 2020-09-11 DIAGNOSIS — F430 Acute stress reaction: Secondary | ICD-10-CM | POA: Diagnosis not present

## 2020-09-11 MED ORDER — ALPRAZOLAM 0.25 MG PO TABS: 0.2500 mg | ORAL_TABLET | Freq: Every day | ORAL | 0 refills | Status: DC

## 2020-09-11 NOTE — Assessment & Plan Note (Signed)
Patient does not have any angina coronary artery disease is stable chest is clear heart is regular abdominal soft nontender.

## 2020-09-11 NOTE — Assessment & Plan Note (Signed)
-   Patient experiencing high levels of anxiety.  - Encouraged patient to engage in relaxing activities like yoga, meditation, journaling, going for a walk, or participating in a hobby.  - Encouraged patient to reach out to trusted friends or family members about recent struggles 

## 2020-09-11 NOTE — Assessment & Plan Note (Signed)
-   The patient's GERD is stable on medication.  - Instructed the patient to avoid eating spicy and acidic foods, as well as foods high in fat. - Instructed the patient to avoid eating large meals or meals 2-3 hours prior to sleeping. 

## 2020-09-11 NOTE — Assessment & Plan Note (Signed)
Hypercholesterolemia  I advised the patient to follow Mediterranean diet This diet is rich in fruits vegetables and whole grain, and This diet is also rich in fish and lean meat Patient should also eat a handful of almonds or walnuts daily Recent heart study indicated that average follow-up on this kind of diet reduces the cardiovascular mortality by 50 to 70%== 

## 2020-09-11 NOTE — Progress Notes (Signed)
Established Patient Office Visit  Subjective:  Patient ID: Natasha Murray, female    DOB: 07/10/1940  Age: 80 y.o. MRN: [id number]  CC:  Chief Complaint  Patient presents with  . Anxiety    Patient is here for her medication refill    HPI  [name] presents for blood pressure checkup.  Patient denies any chest pain or shortness of breath.  He had a bypass surgery in 2013 and is doing well.  He does not have any desire or palpitation.  He does not smoke does not drink.  Her reflux is stable. Past Medical History:  Diagnosis Date  . CAD (coronary artery disease)   . GERD (gastroesophageal reflux disease)   . HLD (hyperlipidemia)   . Hypertension   . MI, old 45  . Myocardial infarction ([place]) 10/14/2011    Past Surgical History:  Procedure Laterality Date  . ABDOMINAL HYSTERECTOMY    . BREAST EXCISIONAL BIOPSY Left    surgical bx neg ? late 1990's  . COLONOSCOPY WITH PROPOFOL N/A 03/13/2016   Procedure: COLONOSCOPY WITH PROPOFOL;  Surgeon: Lucilla Lame, MD;  Location: [place] [facility];  Service: Endoscopy;  Laterality: N/A;  . CORONARY ARTERY BYPASS GRAFT  10/15/2011   5 vessel. [facility]  . ESOPHAGOGASTRODUODENOSCOPY (EGD) WITH PROPOFOL N/A 03/13/2016   Procedure: ESOPHAGOGASTRODUODENOSCOPY (EGD) WITH PROPOFOL;  Surgeon: Lucilla Lame, MD;  Location: [facility];  Service: Endoscopy;  Laterality: N/A;  . POLYPECTOMY N/A 03/13/2016   Procedure: POLYPECTOMY;  Surgeon: Lucilla Lame, MD;  Location: [place];  Service: Endoscopy;  Laterality: N/A;    Family History  Problem Relation Age of Onset  . Peripheral vascular disease Father   . Prostate cancer Brother   . CAD Mother   . Hypertension Mother   . Breast cancer Sister 8    Social History   Socioeconomic History  . Marital status: Widowed    Spouse name: Not on file  . Number of children: Not on file  . Years of education: Not on file  . Highest education level: Not on file  Occupational  History  . Not on file  Tobacco Use  . Smoking status: Never Smoker  . Smokeless tobacco: Never Used  Substance and Sexual Activity  . Alcohol use: No  . Drug use: No  . Sexual activity: Not on file  Other Topics Concern  . Not on file  Social History Narrative  . Not on file   Social Determinants of Health   Financial Resource Strain: Not on file  Food Insecurity: Not on file  Transportation Needs: Not on file  Physical Activity: Not on file  Stress: Not on file  Social Connections: Not on file  Intimate Partner Violence: Not on file     Current Outpatient Medications:  .  allopurinol (ZYLOPRIM) 100 MG tablet, Take 1 tablet (100 mg total) by mouth daily., Disp: 30 tablet, Rfl: 6 .  ALPRAZolam (XANAX) 0.25 MG tablet, Take 1 tablet (0.25 mg total) by mouth at bedtime., Disp: 30 tablet, Rfl: 0 .  amLODipine (NORVASC) 5 MG tablet, TAKE 1 TABLET BY MOUTH TWICE A DAY, Disp: 180 tablet, Rfl: 1 .  aspirin EC 81 MG tablet, Take 81 mg by mouth daily., Disp: , Rfl:  .  cyanocobalamin (,VITAMIN B-12,) 1000 MCG/ML injection, INJECT 0.5 MLS (500 MCG TOTAL) INTO THE MUSCLE EVERY 30 (THIRTY) DAYS., Disp: 3 mL, Rfl: 1 .  hydrochlorothiazide (HYDRODIURIL) 25 MG tablet, Take 1 tablet (25 mg total)  by mouth daily., Disp: 90 tablet, Rfl: 3 .  metoprolol (LOPRESSOR) 100 MG tablet, Take 100 mg by mouth 2 (two) times daily., Disp: , Rfl:  .  MITIGARE 0.6 MG CAPS, TAKE 1 TABLET BY MOUTH 2 TIMES DAILY. (COVERS MITIGARE), Disp: 60 capsule, Rfl: 6 .  nitroGLYCERIN (NITROSTAT) 0.4 MG SL tablet, Place 1 tablet (0.4 mg total) under the tongue every 5 (five) minutes as needed for chest pain., Disp: 50 tablet, Rfl: 3 .  ondansetron (ZOFRAN ODT) 4 MG disintegrating tablet, Take 1 tablet (4 mg total) by mouth every 8 (eight) hours as needed for nausea or vomiting., Disp: 20 tablet, Rfl: 0 .  pantoprazole (PROTONIX) 20 MG tablet, TAKE 1 TABLET BY MOUTH EVERY DAY, Disp: 30 tablet, Rfl: 1 .  pravastatin (PRAVACHOL)  20 MG tablet, Take 1 tablet (20 mg total) by mouth every evening., Disp: 90 tablet, Rfl: 3 .  valsartan (DIOVAN) 80 MG tablet, TAKE 1 TABLET (80 MG TOTAL) BY MOUTH 2 (TWO) TIMES DAILY., Disp: 180 tablet, Rfl: 2   Allergies  Allergen Reactions  . Codeine Nausea And Vomiting    ROS Review of Systems  Constitutional: Negative.   HENT: Negative.   Eyes: Negative.   Respiratory: Negative.   Cardiovascular: Negative.   Gastrointestinal: Negative.   Endocrine: Negative.   Genitourinary: Negative.   Musculoskeletal: Negative.   Skin: Negative.   Allergic/Immunologic: Negative.   Neurological: Negative.   Hematological: Negative.   Psychiatric/Behavioral: Negative.   All other systems reviewed and are negative.     Objective:    Physical Exam Vitals reviewed.  Constitutional:      Appearance: Normal appearance.  HENT:     Mouth/Throat:     Mouth: Mucous membranes are moist.  Eyes:     Pupils: Pupils are equal, round, and reactive to light.  Neck:     Vascular: No carotid bruit.  Cardiovascular:     Rate and Rhythm: Normal rate and regular rhythm.     Pulses: Normal pulses.     Heart sounds: Normal heart sounds.  Pulmonary:     Effort: Pulmonary effort is normal.     Breath sounds: Normal breath sounds.  Abdominal:     General: Bowel sounds are normal.     Palpations: Abdomen is soft. There is no hepatomegaly, splenomegaly or mass.     Tenderness: There is no abdominal tenderness.     Hernia: No hernia is present.  Musculoskeletal:        General: No tenderness.     Cervical back: Neck supple.     Right lower leg: No edema.     Left lower leg: No edema.  Skin:    Findings: No rash.  Neurological:     Mental Status: She is alert and oriented to person, place, and time.     Motor: No weakness.  Psychiatric:        Mood and Affect: Mood and affect normal.        Behavior: Behavior normal.     BP 130/85   Pulse (!) 57   Ht 4\' 10"  (1.473 m)   Wt 165 lb (74.8  kg)   BMI 34.49 kg/m  Wt Readings from Last 3 Encounters:  09/11/20 165 lb (74.8 kg)  06/11/20 161 lb 14.4 oz (73.4 kg)  03/13/20 164 lb 4.8 oz (74.5 kg)     Health Maintenance Due  Topic Date Due  . COVID-19 Vaccine (1) Never done  . Pneumococcal Vaccine 0-64 Years  old (1 of 2 - PPSV23) Never done  . Hepatitis C Screening  Never done  . TETANUS/TDAP  Never done  . Zoster Vaccines- Shingrix (1 of 2) Never done  . DEXA SCAN  Never done  . PNA vac Low Risk Adult (1 of 2 - PCV13) Never done    There are no preventive care reminders to display for this patient.  Lab Results  Component Value Date   TSH 2.36 11/24/2019   Lab Results  Component Value Date   WBC 5.7 11/24/2019   HGB 12.5 11/24/2019   HCT 36.8 11/24/2019   MCV 94.4 11/24/2019   PLT 249 11/24/2019   Lab Results  Component Value Date   NA 132 (L) 11/24/2019   K 3.9 11/24/2019   CO2 29 11/24/2019   GLUCOSE 92 11/24/2019   BUN 39 (H) 11/24/2019   CREATININE 1.50 (H) 11/24/2019   BILITOT 0.5 11/24/2019   ALKPHOS 90 01/28/2016   AST 18 11/24/2019   ALT 23 11/24/2019   PROT 7.8 11/24/2019   ALBUMIN 3.9 01/28/2016   CALCIUM 8.7 11/24/2019   ANIONGAP 10 04/09/2017   Lab Results  Component Value Date   CHOL 159 11/24/2019   Lab Results  Component Value Date   HDL 56 11/24/2019   Lab Results  Component Value Date   LDLCALC 80 11/24/2019   Lab Results  Component Value Date   TRIG 132 11/24/2019   Lab Results  Component Value Date   CHOLHDL 2.8 11/24/2019   Lab Results  Component Value Date   HGBA1C 5.8 (H) 10/13/2019      Assessment & Plan:   Problem List Items Addressed This Visit      Cardiovascular and Mediastinum   Primary hypertension - Primary    Patient blood pressure is normal patient denies any chest pain or shortness of breath there is no history of palpitation or paroxysmal nocturnal dyspnea   patient was advised to follow low-salt low-cholesterol diet    ideally I want to  keep systolic blood pressure below 130 mmHg, patient was asked to check blood pressure one times a week and give me a report on that.  Patient will be follow-up in 3 months  or earlier as needed, patient will call me back for any change in the cardiovascular symptoms Patient was advised to buy a book from local bookstore concerning blood pressure and read several chapters  every day.  This will be supplemented by some of the material we will give him from the office.  Patient should also utilize other resources like YouTube and Internet to learn more about the blood pressure and the diet.      CAD (coronary artery disease)    Patient does not have any angina coronary artery disease is stable chest is clear heart is regular abdominal soft nontender.        Digestive   Gastroesophageal reflux disease without esophagitis    - The patient's GERD is stable on medication.  - Instructed the patient to avoid eating spicy and acidic foods, as well as foods high in fat. - Instructed the patient to avoid eating large meals or meals 2-3 hours prior to sleeping.        Other   Moderate mixed hyperlipidemia not requiring statin therapy    Hypercholesterolemia  I advised the patient to follow Mediterranean diet This diet is rich in fruits vegetables and whole grain, and This diet is also rich in fish and lean meat Patient  should also eat a handful of almonds or walnuts daily Recent heart study indicated that average follow-up on this kind of diet reduces the cardiovascular mortality by 50 to 70%==      Anxiety as acute reaction to exceptional stress    - Patient experiencing high levels of anxiety.  - Encouraged patient to engage in relaxing activities like yoga, meditation, journaling, going for a walk, or participating in a hobby.  - Encouraged patient to reach out to trusted friends or family members about recent struggles      Relevant Medications   ALPRAZolam (XANAX) 0.25 MG tablet    Obesity (BMI 30.0-34.9)    - I encouraged the patient to lose weight.  - I educated them on making healthy dietary choices including eating more fruits and vegetables and less fried foods. - I encouraged the patient to exercise more, and educated on the benefits of exercise including weight loss, diabetes prevention, and hypertension prevention.   Dietary counseling with a registered dietician  Referral to a weight management support group (e.g. Weight Watchers, Overeaters Anonymous)  If your BMI is greater than 29 or you have gained more than 15 pounds you should work on weight loss.  Attend a healthy cooking class          Meds ordered this encounter  Medications  . ALPRAZolam (XANAX) 0.25 MG tablet    Sig: Take 1 tablet (0.25 mg total) by mouth at bedtime.    Dispense:  30 tablet    Refill:  0    Follow-up: No follow-ups on file.    Cletis Athens, MD

## 2020-09-11 NOTE — Assessment & Plan Note (Signed)

## 2020-09-11 NOTE — Assessment & Plan Note (Signed)

## 2020-09-29 ENCOUNTER — Other Ambulatory Visit: Payer: Self-pay | Admitting: Internal Medicine

## 2020-10-20 ENCOUNTER — Other Ambulatory Visit: Payer: Self-pay | Admitting: Internal Medicine

## 2020-10-20 DIAGNOSIS — K5900 Constipation, unspecified: Secondary | ICD-10-CM

## 2020-10-20 DIAGNOSIS — K5909 Other constipation: Secondary | ICD-10-CM

## 2020-10-23 ENCOUNTER — Other Ambulatory Visit: Payer: Self-pay

## 2020-10-23 ENCOUNTER — Ambulatory Visit
Admission: RE | Admit: 2020-10-23 | Discharge: 2020-10-23 | Disposition: A | Discharge status: Home / Self Care | Payer: Medicare PPO | Source: Ambulatory Visit | Attending: Internal Medicine | Admitting: Internal Medicine

## 2020-10-23 DIAGNOSIS — Z1231 Encounter for screening mammogram for malignant neoplasm of breast: Secondary | ICD-10-CM | POA: Insufficient documentation

## 2020-11-02 ENCOUNTER — Other Ambulatory Visit: Payer: Self-pay | Admitting: Internal Medicine

## 2020-11-10 ENCOUNTER — Other Ambulatory Visit: Payer: Self-pay | Admitting: Internal Medicine

## 2020-11-24 ENCOUNTER — Other Ambulatory Visit: Payer: Self-pay | Admitting: Internal Medicine

## 2020-12-04 ENCOUNTER — Other Ambulatory Visit: Payer: Self-pay | Admitting: Internal Medicine

## 2020-12-17 ENCOUNTER — Encounter: Payer: Medicare PPO | Admitting: Internal Medicine

## 2020-12-19 ENCOUNTER — Ambulatory Visit (INDEPENDENT_AMBULATORY_CARE_PROVIDER_SITE_OTHER): Payer: Medicare PPO

## 2020-12-19 DIAGNOSIS — Z23 Encounter for immunization: Secondary | ICD-10-CM

## 2020-12-19 DIAGNOSIS — Z0000 Encounter for general adult medical examination without abnormal findings: Secondary | ICD-10-CM | POA: Diagnosis not present

## 2020-12-20 LAB — CBC WITH DIFFERENTIAL/PLATELET
Absolute Monocytes: 437 cells/uL (ref 200–950)
Basophils Absolute: 21 cells/uL (ref 0–200)
Basophils Relative: 0.5 %
Eosinophils Absolute: 210 cells/uL (ref 15–500)
Eosinophils Relative: 5 %
HCT: 38.6 % (ref 35.0–45.0)
Hemoglobin: 12.9 g/dL (ref 11.7–15.5)
Lymphs Abs: 1453 cells/uL (ref 850–3900)
MCH: 31.3 pg (ref 27.0–33.0)
MCHC: 33.4 g/dL (ref 32.0–36.0)
MCV: 93.7 fL (ref 80.0–100.0)
MPV: 10.9 fL (ref 7.5–12.5)
Monocytes Relative: 10.4 %
Neutro Abs: 2079 cells/uL (ref 1500–7800)
Neutrophils Relative %: 49.5 %
Platelets: 228 10*3/uL (ref 140–400)
RBC: 4.12 10*6/uL (ref 3.80–5.10)
RDW: 13.2 % (ref 11.0–15.0)
Total Lymphocyte: 34.6 %
WBC: 4.2 10*3/uL (ref 3.8–10.8)

## 2020-12-20 LAB — LIPID PANEL
Cholesterol: 192 mg/dL (ref <200)
HDL: 66 mg/dL (ref >=50)
LDL Cholesterol (Calc): 102 mg/dL (calc) — ABNORMAL HIGH
Non-HDL Cholesterol (Calc): 126 mg/dL (calc) (ref <130)
Total CHOL/HDL Ratio: 2.9 (calc) (ref <5.0)
Triglycerides: 142 mg/dL (ref <150)

## 2020-12-20 LAB — COMPLETE METABOLIC PANEL WITH GFR
AG Ratio: 0.8 (calc) — ABNORMAL LOW (ref 1.0–2.5)
ALT: 13 U/L (ref 6–29)
AST: 17 U/L (ref 10–35)
Albumin: 3.9 g/dL (ref 3.6–5.1)
Alkaline phosphatase (APISO): 92 U/L (ref 37–153)
BUN/Creatinine Ratio: 14 (calc) (ref 6–22)
BUN: 18 mg/dL (ref 7–25)
CO2: 29 mmol/L (ref 20–32)
Calcium: 9.4 mg/dL (ref 8.6–10.4)
Chloride: 97 mmol/L — ABNORMAL LOW (ref 98–110)
Creat: 1.33 mg/dL — ABNORMAL HIGH (ref 0.60–1.00)
Globulin: 4.6 g/dL (calc) — ABNORMAL HIGH (ref 1.9–3.7)
Glucose, Bld: 91 mg/dL (ref 65–99)
Potassium: 4.1 mmol/L (ref 3.5–5.3)
Sodium: 137 mmol/L (ref 135–146)
Total Bilirubin: 0.5 mg/dL (ref 0.2–1.2)
Total Protein: 8.5 g/dL — ABNORMAL HIGH (ref 6.1–8.1)
eGFR: 41 mL/min/{1.73_m2} — ABNORMAL LOW (ref >=60)

## 2020-12-20 LAB — TSH: TSH: 1.89 mIU/L (ref 0.40–4.50)

## 2020-12-24 ENCOUNTER — Encounter: Payer: Self-pay | Admitting: Internal Medicine

## 2020-12-24 ENCOUNTER — Ambulatory Visit (INDEPENDENT_AMBULATORY_CARE_PROVIDER_SITE_OTHER): Payer: Medicare PPO | Admitting: Internal Medicine

## 2020-12-24 ENCOUNTER — Other Ambulatory Visit: Payer: Self-pay

## 2020-12-24 VITALS — BP 150/68 | HR 78 | Ht <= 58 in | Wt 163.0 lb

## 2020-12-24 DIAGNOSIS — I10 Essential (primary) hypertension: Secondary | ICD-10-CM

## 2020-12-24 DIAGNOSIS — K2930 Chronic superficial gastritis without bleeding: Secondary | ICD-10-CM | POA: Diagnosis not present

## 2020-12-24 DIAGNOSIS — F430 Acute stress reaction: Secondary | ICD-10-CM

## 2020-12-24 DIAGNOSIS — I25810 Atherosclerosis of coronary artery bypass graft(s) without angina pectoris: Secondary | ICD-10-CM | POA: Diagnosis not present

## 2020-12-24 DIAGNOSIS — Z20822 Contact with and (suspected) exposure to covid-19: Secondary | ICD-10-CM | POA: Diagnosis not present

## 2020-12-24 DIAGNOSIS — E669 Obesity, unspecified: Secondary | ICD-10-CM

## 2020-12-24 DIAGNOSIS — Z0000 Encounter for general adult medical examination without abnormal findings: Secondary | ICD-10-CM | POA: Diagnosis not present

## 2020-12-24 DIAGNOSIS — E782 Mixed hyperlipidemia: Secondary | ICD-10-CM

## 2020-12-24 DIAGNOSIS — F411 Generalized anxiety disorder: Secondary | ICD-10-CM

## 2020-12-24 LAB — POC COVID19 BINAXNOW: SARS Coronavirus 2 Ag: NEGATIVE

## 2020-12-24 MED ORDER — ALPRAZOLAM 0.25 MG PO TABS: 0.2500 mg | ORAL_TABLET | Freq: Every day | ORAL | 0 refills | Status: DC

## 2020-12-24 NOTE — Assessment & Plan Note (Signed)
Patient was advised to follow low-cholesterol diet

## 2020-12-24 NOTE — Assessment & Plan Note (Signed)
She does not have any GI bleeding stomach pain nausea vomiting or black stool

## 2020-12-24 NOTE — Progress Notes (Signed)
Established Patient Office Visit  Subjective:  Patient ID: Natasha Murray, female    DOB: 04/13/1940  Age: 80 y.o. MRN: [id number]  CC:  Chief Complaint  Patient presents with   Annual Exam    Physical exam    HPI  Natasha Murray presents for check up  Past Medical History:  Diagnosis Date   CAD (coronary artery disease)    GERD (gastroesophageal reflux disease)    HLD (hyperlipidemia)    Hypertension    MI, old 1995   Myocardial infarction ([place]) 10/14/2011    Past Surgical History:  Procedure Laterality Date   ABDOMINAL HYSTERECTOMY     BREAST EXCISIONAL BIOPSY Left    surgical bx neg ? late 1990's   COLONOSCOPY WITH PROPOFOL N/A 03/13/2016   Procedure: COLONOSCOPY WITH PROPOFOL;  Surgeon: Lucilla Lame, MD;  Location: [facility];  Service: Endoscopy;  Laterality: N/A;   CORONARY ARTERY BYPASS GRAFT  10/15/2011   5 vessel. [facility]   ESOPHAGOGASTRODUODENOSCOPY (EGD) WITH PROPOFOL N/A 03/13/2016   Procedure: ESOPHAGOGASTRODUODENOSCOPY (EGD) WITH PROPOFOL;  Surgeon: Lucilla Lame, MD;  Location: [facility];  Service: Endoscopy;  Laterality: N/A;   POLYPECTOMY N/A 03/13/2016   Procedure: POLYPECTOMY;  Surgeon: Lucilla Lame, MD;  Location: [place];  Service: Endoscopy;  Laterality: N/A;    Family History  Problem Relation Age of Onset   Peripheral vascular disease Father    Prostate cancer Brother    CAD Mother    Hypertension Mother    Breast cancer Sister 21    Social History   Socioeconomic History   Marital status: Widowed    Spouse name: Not on file   Number of children: Not on file   Years of education: Not on file   Highest education level: Not on file  Occupational History   Not on file  Tobacco Use   Smoking status: Never   Smokeless tobacco: Never  Substance and Sexual Activity   Alcohol use: No   Drug use: No   Sexual activity: Not on file  Other Topics Concern   Not on file  Social History Narrative   Not on file    Social Determinants of Health   Financial Resource Strain: Not on file  Food Insecurity: Not on file  Transportation Needs: Not on file  Physical Activity: Not on file  Stress: Not on file  Social Connections: Not on file  Intimate Partner Violence: Not on file     Current Outpatient Medications:    allopurinol (ZYLOPRIM) 100 MG tablet, Take 1 tablet (100 mg total) by mouth daily., Disp: 30 tablet, Rfl: 6   ALPRAZolam (XANAX) 0.25 MG tablet, Take 1 tablet (0.25 mg total) by mouth at bedtime., Disp: 30 tablet, Rfl: 0   amLODipine (NORVASC) 5 MG tablet, TAKE 1 TABLET BY MOUTH TWICE A DAY, Disp: 180 tablet, Rfl: 1   aspirin EC 81 MG tablet, Take 81 mg by mouth daily., Disp: , Rfl:    cyanocobalamin (,VITAMIN B-12,) 1000 MCG/ML injection, INJECT 0.5 MLS (500 MCG TOTAL) INTO THE MUSCLE EVERY 30 (THIRTY) DAYS., Disp: 3 mL, Rfl: 1   hydrochlorothiazide (HYDRODIURIL) 25 MG tablet, TAKE 1 TABLET BY MOUTH EVERY DAY, Disp: 90 tablet, Rfl: 3   metoprolol tartrate (LOPRESSOR) 100 MG tablet, TAKE 1 TABLET BY MOUTH TWICE A DAY, Disp: 180 tablet, Rfl: 4   MITIGARE 0.6 MG CAPS, TAKE 1 TABLET BY MOUTH 2 TIMES DAILY. (COVERS MITIGARE), Disp: 60 capsule, Rfl: 6  nitroGLYCERIN (NITROSTAT) 0.4 MG SL tablet, Place 1 tablet (0.4 mg total) under the tongue every 5 (five) minutes as needed for chest pain., Disp: 50 tablet, Rfl: 3   ondansetron (ZOFRAN ODT) 4 MG disintegrating tablet, Take 1 tablet (4 mg total) by mouth every 8 (eight) hours as needed for nausea or vomiting., Disp: 20 tablet, Rfl: 0   pantoprazole (PROTONIX) 20 MG tablet, TAKE 1 TABLET BY MOUTH EVERY DAY, Disp: 90 tablet, Rfl: 1   pravastatin (PRAVACHOL) 20 MG tablet, Take 1 tablet (20 mg total) by mouth every evening., Disp: 90 tablet, Rfl: 3   valsartan (DIOVAN) 80 MG tablet, TAKE 1 TABLET (80 MG TOTAL) BY MOUTH 2 (TWO) TIMES DAILY., Disp: 180 tablet, Rfl: 2   Allergies  Allergen Reactions   Codeine Nausea And Vomiting    ROS Review of  Systems  Constitutional: Negative.   HENT: Negative.    Eyes: Negative.   Respiratory: Negative.    Cardiovascular: Negative.   Gastrointestinal: Negative.   Endocrine: Negative.   Genitourinary: Negative.   Musculoskeletal: Negative.   Skin: Negative.   Allergic/Immunologic: Negative.   Neurological: Negative.   Hematological: Negative.   Psychiatric/Behavioral: Negative.    All other systems reviewed and are negative.    Objective:    Physical Exam Vitals reviewed.  Constitutional:      Appearance: Normal appearance.  HENT:     Mouth/Throat:     Mouth: Mucous membranes are moist.  Eyes:     Pupils: Pupils are equal, round, and reactive to light.  Neck:     Vascular: No carotid bruit.  Cardiovascular:     Rate and Rhythm: Normal rate and regular rhythm.     Pulses: Normal pulses.     Heart sounds: Normal heart sounds.  Pulmonary:     Effort: Pulmonary effort is normal.     Breath sounds: Normal breath sounds.  Abdominal:     General: Bowel sounds are normal.     Palpations: Abdomen is soft. There is no hepatomegaly, splenomegaly or mass.     Tenderness: There is no abdominal tenderness.     Hernia: No hernia is present.  Musculoskeletal:        General: No tenderness.     Cervical back: Neck supple.     Right lower leg: No edema.     Left lower leg: No edema.  Skin:    Findings: No rash.  Neurological:     Mental Status: She is alert and oriented to person, place, and time.     Motor: No weakness.  Psychiatric:        Mood and Affect: Mood and affect normal.        Behavior: Behavior normal.    BP (!) 150/68   Pulse 78   Ht _0  (1.473 m)   Wt 163 lb (73.9 kg)   BMI 34.07 kg/m  Wt Readings from Last 3 Encounters:  12/24/20 163 lb (73.9 kg)  09/11/20 165 lb (74.8 kg)  06/11/20 161 lb 14.4 oz (73.4 kg)     Health Maintenance Due  Topic Date Due   Hepatitis C Screening  Never done   DEXA SCAN  Never done   Zoster Vaccines- Shingrix (2 of 2)  10/14/2018    There are no preventive care reminders to display for this patient.  Lab Results  Component Value Date   TSH 1.89 12/19/2020   Lab Results  Component Value Date   WBC 4.2 12/19/2020  HGB 12.9 12/19/2020   HCT 38.6 12/19/2020   MCV 93.7 12/19/2020   PLT 228 12/19/2020   Lab Results  Component Value Date   NA 137 12/19/2020   K 4.1 12/19/2020   CO2 29 12/19/2020   GLUCOSE 91 12/19/2020   BUN 18 12/19/2020   CREATININE 1.33 (H) 12/19/2020   BILITOT 0.5 12/19/2020   ALKPHOS 90 01/28/2016   AST 17 12/19/2020   ALT 13 12/19/2020   PROT 8.5 (H) 12/19/2020   ALBUMIN 3.9 01/28/2016   CALCIUM 9.4 12/19/2020   ANIONGAP 10 04/09/2017   EGFR 41 (L) 12/19/2020   Lab Results  Component Value Date   CHOL 192 12/19/2020   Lab Results  Component Value Date   HDL 66 12/19/2020   Lab Results  Component Value Date   LDLCALC 102 (H) 12/19/2020   Lab Results  Component Value Date   TRIG 142 12/19/2020   Lab Results  Component Value Date   CHOLHDL 2.9 12/19/2020   Lab Results  Component Value Date   HGBA1C 5.8 (H) 10/13/2019      Assessment & Plan:   Problem List Items Addressed This Visit       Cardiovascular and Mediastinum   Primary hypertension    Blood pressure is stable on the present medication      CAD (coronary artery disease)    Patient denies any chest pain or shortness of breath.  Her main problem is obesity, she was advised to lose weight she exercises antibiotic every day for more than 30 minutes        Digestive   Gastritis without bleeding    She does not have any GI bleeding stomach pain nausea vomiting or black stool        Other   Moderate mixed hyperlipidemia not requiring statin therapy    Patient was advised to follow low-cholesterol diet      Annual physical exam    Patient physical examination is normal heart is regular chest is clear abdomen is soft nontender without any hepatosplenomegaly there is no tenderness  in the calf there is no pedal edema.  Patient was advised to lose weight.      Obesity (BMI 30.0-34.9)    Patient was advised to lose weight.      Other Visit Diagnoses     Suspected COVID-19 virus infection    -  Primary   Relevant Orders   POC COVID-19 (Completed)       No orders of the defined types were placed in this encounter.   Follow-up: No follow-ups on file.    Cletis Athens, MD

## 2020-12-24 NOTE — Assessment & Plan Note (Signed)
Patient physical examination is normal heart is regular chest is clear abdomen is soft nontender without any hepatosplenomegaly there is no tenderness in the calf there is no pedal edema.  Patient was advised to lose weight.

## 2020-12-24 NOTE — Assessment & Plan Note (Signed)
Blood pressure is stable on the present medication 

## 2020-12-24 NOTE — Assessment & Plan Note (Signed)
Patient denies any chest pain or shortness of breath.  Her main problem is obesity, she was advised to lose weight she exercises antibiotic every day for more than 30 minutes

## 2020-12-24 NOTE — Assessment & Plan Note (Signed)
Patient was advised to lose weight 

## 2021-01-07 ENCOUNTER — Other Ambulatory Visit: Payer: Self-pay | Admitting: *Deleted

## 2021-01-07 MED ORDER — AMLODIPINE BESYLATE 5 MG PO TABS: 5.0000 mg | ORAL_TABLET | Freq: Two times a day (BID) | ORAL | 1 refills | Status: DC

## 2021-02-21 ENCOUNTER — Ambulatory Visit (INDEPENDENT_AMBULATORY_CARE_PROVIDER_SITE_OTHER): Payer: Medicare PPO | Admitting: *Deleted

## 2021-02-21 DIAGNOSIS — F411 Generalized anxiety disorder: Secondary | ICD-10-CM

## 2021-02-21 DIAGNOSIS — F430 Acute stress reaction: Secondary | ICD-10-CM | POA: Diagnosis not present

## 2021-02-21 DIAGNOSIS — Z0000 Encounter for general adult medical examination without abnormal findings: Secondary | ICD-10-CM | POA: Diagnosis not present

## 2021-02-21 NOTE — Progress Notes (Signed)
Subjective:   Natasha Murray is a 80 y.o. female who presents for Medicare Annual (Subsequent) preventive examination.  I discussed the limitations of evaluation and management by telemedicine and the availability of in person appointments. Patient expressed understanding and agreed to proceed.   Visit performed using audio  Patient:home Provider:home   Review of Systems    Defer to provider       Objective:    There were no vitals filed for this visit. There is no height or weight on file to calculate BMI.  Advanced Directives 02/21/2021 03/13/2016 01/28/2016 01/06/2016 12/17/2015 12/16/2015  Does Patient Have a Medical Advance Directive? No No No No No No  Would patient like information on creating a medical advance directive? No - Patient declined No - Patient declined - - - Yes - Educational materials given    Current Medications (verified) Outpatient Encounter Medications as of 02/21/2021  Medication Sig   allopurinol (ZYLOPRIM) 100 MG tablet Take 1 tablet (100 mg total) by mouth daily.   ALPRAZolam (XANAX) 0.25 MG tablet Take 1 tablet (0.25 mg total) by mouth at bedtime.   amLODipine (NORVASC) 5 MG tablet Take 1 tablet (5 mg total) by mouth 2 (two) times daily.   aspirin EC 81 MG tablet Take 81 mg by mouth daily.   cyanocobalamin (,VITAMIN B-12,) 1000 MCG/ML injection INJECT 0.5 MLS (500 MCG TOTAL) INTO THE MUSCLE EVERY 30 (THIRTY) DAYS.   hydrochlorothiazide (HYDRODIURIL) 25 MG tablet TAKE 1 TABLET BY MOUTH EVERY DAY   metoprolol tartrate (LOPRESSOR) 100 MG tablet TAKE 1 TABLET BY MOUTH TWICE A DAY   MITIGARE 0.6 MG CAPS TAKE 1 TABLET BY MOUTH 2 TIMES DAILY. (COVERS MITIGARE)   nitroGLYCERIN (NITROSTAT) 0.4 MG SL tablet Place 1 tablet (0.4 mg total) under the tongue every 5 (five) minutes as needed for chest pain.   ondansetron (ZOFRAN ODT) 4 MG disintegrating tablet Take 1 tablet (4 mg total) by mouth every 8 (eight) hours as needed for nausea or vomiting.    pantoprazole (PROTONIX) 20 MG tablet TAKE 1 TABLET BY MOUTH EVERY DAY   pravastatin (PRAVACHOL) 20 MG tablet Take 1 tablet (20 mg total) by mouth every evening.   valsartan (DIOVAN) 80 MG tablet TAKE 1 TABLET (80 MG TOTAL) BY MOUTH 2 (TWO) TIMES DAILY.   No facility-administered encounter medications on file as of 02/21/2021.    Allergies (verified) Codeine   History: Past Medical History:  Diagnosis Date   CAD (coronary artery disease)    GERD (gastroesophageal reflux disease)    HLD (hyperlipidemia)    Hypertension    MI, old 1995   Myocardial infarction ([place]) 10/14/2011   Past Surgical History:  Procedure Laterality Date   ABDOMINAL HYSTERECTOMY     BREAST EXCISIONAL BIOPSY Left    surgical bx neg ? late 1990's   COLONOSCOPY WITH PROPOFOL N/A 03/13/2016   Procedure: COLONOSCOPY WITH PROPOFOL;  Surgeon: Lucilla Lame, MD;  Location: [facility];  Service: Endoscopy;  Laterality: N/A;   CORONARY ARTERY BYPASS GRAFT  10/15/2011   5 vessel. [facility]   ESOPHAGOGASTRODUODENOSCOPY (EGD) WITH PROPOFOL N/A 03/13/2016   Procedure: ESOPHAGOGASTRODUODENOSCOPY (EGD) WITH PROPOFOL;  Surgeon: Lucilla Lame, MD;  Location: [facility];  Service: Endoscopy;  Laterality: N/A;   POLYPECTOMY N/A 03/13/2016   Procedure: POLYPECTOMY;  Surgeon: Lucilla Lame, MD;  Location: [facility];  Service: Endoscopy;  Laterality: N/A;   Family History  Problem Relation Age of Onset   Peripheral vascular disease Father  Prostate cancer Brother    CAD Mother    Hypertension Mother    Breast cancer Sister 55   Social History   Socioeconomic History   Marital status: Widowed    Spouse name: Not on file   Number of children: Not on file   Years of education: Not on file   Highest education level: Not on file  Occupational History   Not on file  Tobacco Use   Smoking status: Never   Smokeless tobacco: Never  Substance and Sexual Activity   Alcohol use: No   Drug use: No   Sexual  activity: Not on file  Other Topics Concern   Not on file  Social History Narrative   Not on file   Social Determinants of Health   Financial Resource Strain: Not on file  Food Insecurity: Not on file  Transportation Needs: Not on file  Physical Activity: Not on file  Stress: Not on file  Social Connections: Not on file    Tobacco Counseling Counseling given: Not Answered   Clinical Intake:  Pre-visit preparation completed: Yes  Pain : No/denies pain     Nutritional Risks: None Diabetes: No  How often do you need to have someone help you when you read instructions, pamphlets, or other written materials from your doctor or pharmacy?: 1 - Never What is the last grade level you completed in school?: 12  Diabetic?no  Interpreter Needed?: No  Information entered by :: Lacretia Nicks CMA   Activities of Daily Living In your present state of health, do you have any difficulty performing the following activities: 02/21/2021  Hearing? N  Vision? N  Difficulty concentrating or making decisions? N  Walking or climbing stairs? N  Dressing or bathing? N  Doing errands, shopping? N  Some recent data might be hidden    Patient Care Team: Cletis Athens, MD as PCP - General (Internal Medicine)  Indicate any recent Medical Services you may have received from other than [facility] providers in the past year (date may be approximate).     Assessment:   This is a routine wellness examination for Natasha Murray.  Hearing/Vision screen No results found.  Dietary issues and exercise activities discussed:     Goals Addressed   None    Depression Screen PHQ 2/9 Scores 12/08/2019  PHQ - 2 Score 0    Fall Risk Fall Risk  12/08/2019  Falls in the past year? 0  Number falls in past yr: 0  Injury with Fall? 0    FALL RISK PREVENTION PERTAINING TO THE HOME:  Any stairs in or around the home? No  If so, are there any without handrails? No  Home free of loose throw rugs in walkways,  pet beds, electrical cords, etc? Yes  Adequate lighting in your home to reduce risk of falls? Yes   ASSISTIVE DEVICES UTILIZED TO PREVENT FALLS:  Life alert? No  Use of a cane, walker or w/c? No  Grab bars in the bathroom? No  Shower chair or bench in shower? No  Elevated toilet seat or a handicapped toilet? No   TIMED UP AND GO:  Was the test performed? No .  Length of time to ambulate - na   Gait steady and fast without use of assistive device  Cognitive Function:        Immunizations Immunization History  Administered Date(s) Administered   Fluad Quad(high Dose 65+) 12/08/2019, 12/19/2020   PFIZER(Purple Top)SARS-COV-2 Vaccination 05/09/2019, 05/30/2019, 02/01/2020, 07/28/2020  Pneumococcal Polysaccharide-23 02/21/2015   Tdap 12/19/2020   Zoster Recombinat (Shingrix) 08/19/2018    TDAP status: Up to date  Flu Vaccine status: Up to date  Pneumococcal vaccine status: Due, Education has been provided regarding the importance of this vaccine. Advised may receive this vaccine at local pharmacy or Health Dept. Aware to provide a copy of the vaccination record if obtained from local pharmacy or Health Dept. Verbalized acceptance and understanding.  Covid-19 vaccine status: Information provided on how to obtain vaccines.   Qualifies for Shingles Vaccine? Yes   Zostavax completed No   Shingrix Completed?: No.    Education has been provided regarding the importance of this vaccine. Patient has been advised to call insurance company to determine out of pocket expense if they have not yet received this vaccine. Advised may also receive vaccine at local pharmacy or Health Dept. Verbalized acceptance and understanding.  Screening Tests Health Maintenance  Topic Date Due   Hepatitis C Screening  Never done   Pneumonia Vaccine 84+ Years old (2 - PCV) 02/21/2016   COVID-19 Vaccine (5 - Booster for Pfizer series) 09/22/2020   Zoster Vaccines- Shingrix (2 of 2) 05/24/2021  (Originally 10/14/2018)   DEXA SCAN  02/21/2022 (Originally 07/05/2006)   TETANUS/TDAP  12/20/2030   INFLUENZA VACCINE  Completed   HPV VACCINES  Aged Out    Health Maintenance  Health Maintenance Due  Topic Date Due   Hepatitis C Screening  Never done   Pneumonia Vaccine 23+ Years old (2 - PCV) 02/21/2016   COVID-19 Vaccine (5 - Booster for [place] series) 09/22/2020    Colorectal cancer screening: Type of screening: Colonoscopy. Completed 03/13/2016. Repeat every 10 years  Mammogram status: Completed 10/24/2020. Repeat every year    Lung Cancer Screening: (Low Dose CT Chest recommended if Age 36-80 years, 30 pack-year currently smoking OR have quit w/in 15years.) does qualify.   Lung Cancer Screening Referral: Patient declines at this time   Additional Screening:  Hepatitis C Screening: does qualify; Completed No Will obtain with next labs   Vision Screening: Recommended annual ophthalmology exams for early detection of glaucoma and other disorders of the eye. Is the patient up to date with their annual eye exam?  Yes  Who is the provider or what is the name of the office in which the patient attends annual eye exams? [place]  If pt is not established with a provider, would they like to be referred to a provider to establish care? No .   Dental Screening: Recommended annual dental exams for proper oral hygiene  Community Resource Referral / Chronic Care Management: CRR required this visit?  No   CCM required this visit?  No      Plan:     I have personally reviewed and noted the following in the patient's chart:   Medical and social history Use of alcohol, tobacco or illicit drugs  Current medications and supplements including opioid prescriptions.  Functional ability and status Nutritional status Physical activity Advanced directives List of other physicians Hospitalizations, surgeries, and ER visits in previous 12 months Vitals Screenings to include  cognitive, depression, and falls Referrals and appointments  In addition, I have reviewed and discussed with patient certain preventive protocols, quality metrics, and best practice recommendations. A written personalized care plan for preventive services as well as general preventive health recommendations were provided to patient.     Lacretia Nicks, [place]   02/21/2021   Nurse Notes:  Ms. Court , Thank you for  taking time to come for your Medicare Wellness Visit. I appreciate your ongoing commitment to your health goals. Please review the following plan we discussed and let me know if I can assist you in the future.   These are the goals we discussed:  Goals   None     This is a list of the screening recommended for you and due dates:  Health Maintenance  Topic Date Due   Hepatitis C Screening: USPSTF Recommendation to screen - Ages 42-79 yo.  Never done   Pneumonia Vaccine (2 - PCV) 02/21/2016   COVID-19 Vaccine (5 - Booster for Pfizer series) 09/22/2020   Zoster (Shingles) Vaccine (2 of 2) 05/24/2021*   DEXA scan (bone density measurement)  02/21/2022*   Tetanus Vaccine  12/20/2030   Flu Shot  Completed   HPV Vaccine  Aged Out  *Topic was postponed. The date shown is not the original due date.       Time spent 40 min

## 2021-02-23 NOTE — Progress Notes (Signed)
I have reviewed this visit and agree with the documentation.   

## 2021-02-25 ENCOUNTER — Other Ambulatory Visit: Payer: Self-pay | Admitting: Internal Medicine

## 2021-02-25 DIAGNOSIS — F411 Generalized anxiety disorder: Secondary | ICD-10-CM

## 2021-02-25 MED ORDER — ALPRAZOLAM 0.25 MG PO TABS: 0.2500 mg | ORAL_TABLET | Freq: Every day | ORAL | 0 refills | Status: DC

## 2021-02-25 NOTE — Addendum Note (Signed)
Addended by: Alois Cliche on: 02/25/2021 08:58 AM   Modules accepted: Orders

## 2021-03-25 ENCOUNTER — Ambulatory Visit: Payer: Medicare PPO | Admitting: Internal Medicine

## 2021-04-04 ENCOUNTER — Other Ambulatory Visit: Payer: Self-pay | Admitting: Internal Medicine

## 2021-04-23 ENCOUNTER — Other Ambulatory Visit: Payer: Self-pay

## 2021-04-23 ENCOUNTER — Ambulatory Visit: Payer: Medicare PPO | Admitting: Internal Medicine

## 2021-04-23 ENCOUNTER — Encounter: Payer: Self-pay | Admitting: Internal Medicine

## 2021-04-23 VITALS — BP 140/80 | HR 88 | Ht <= 58 in | Wt 159.1 lb

## 2021-04-23 DIAGNOSIS — F411 Generalized anxiety disorder: Secondary | ICD-10-CM

## 2021-04-23 DIAGNOSIS — I10 Essential (primary) hypertension: Secondary | ICD-10-CM | POA: Diagnosis not present

## 2021-04-23 DIAGNOSIS — I25810 Atherosclerosis of coronary artery bypass graft(s) without angina pectoris: Secondary | ICD-10-CM

## 2021-04-23 DIAGNOSIS — F430 Acute stress reaction: Secondary | ICD-10-CM

## 2021-04-23 DIAGNOSIS — J301 Allergic rhinitis due to pollen: Secondary | ICD-10-CM | POA: Diagnosis not present

## 2021-04-23 DIAGNOSIS — K219 Gastro-esophageal reflux disease without esophagitis: Secondary | ICD-10-CM | POA: Diagnosis not present

## 2021-04-23 MED ORDER — VALSARTAN 80 MG PO TABS: 80.0000 mg | ORAL_TABLET | Freq: Two times a day (BID) | ORAL | 2 refills | Status: AC

## 2021-04-23 MED ORDER — ALPRAZOLAM 0.25 MG PO TABS: 0.2500 mg | ORAL_TABLET | Freq: Every day | ORAL | 0 refills | Status: DC

## 2021-04-23 NOTE — Assessment & Plan Note (Signed)

## 2021-04-23 NOTE — Assessment & Plan Note (Signed)
Take Claritin 5 mg as needed for allergy

## 2021-04-23 NOTE — Assessment & Plan Note (Signed)
Stable at the present time denies any chest pain

## 2021-04-23 NOTE — Assessment & Plan Note (Signed)
-   The patient's GERD is stable on medication.  - Instructed the patient to avoid eating spicy and acidic foods, as well as foods high in fat. - Instructed the patient to avoid eating large meals or meals 2-3 hours prior to sleeping. 

## 2021-04-23 NOTE — Progress Notes (Signed)
Established Patient Office Visit  Subjective:  Patient ID: Natasha Murray, female    DOB: 02-23-1941  Age: 81 y.o. MRN: [id number]  CC:  Chief Complaint  Patient presents with   Hypertension   Anxiety    Patient here for xanax refill     Hypertension Associated symptoms include anxiety.  Anxiety     Natasha Murray presents for general check up  Past Medical History:  Diagnosis Date   CAD (coronary artery disease)    GERD (gastroesophageal reflux disease)    HLD (hyperlipidemia)    Hypertension    MI, old 1995   Myocardial infarction ([place]) 10/14/2011    Past Surgical History:  Procedure Laterality Date   ABDOMINAL HYSTERECTOMY     BREAST EXCISIONAL BIOPSY Left    surgical bx neg ? late 1990's   COLONOSCOPY WITH PROPOFOL N/A 03/13/2016   Procedure: COLONOSCOPY WITH PROPOFOL;  Surgeon: Lucilla Lame, MD;  Location: [place];  Service: Endoscopy;  Laterality: N/A;   CORONARY ARTERY BYPASS GRAFT  10/15/2011   5 vessel. [facility]   ESOPHAGOGASTRODUODENOSCOPY (EGD) WITH PROPOFOL N/A 03/13/2016   Procedure: ESOPHAGOGASTRODUODENOSCOPY (EGD) WITH PROPOFOL;  Surgeon: Lucilla Lame, MD;  Location: [facility];  Service: Endoscopy;  Laterality: N/A;   POLYPECTOMY N/A 03/13/2016   Procedure: POLYPECTOMY;  Surgeon: Lucilla Lame, MD;  Location: [place];  Service: Endoscopy;  Laterality: N/A;    Family History  Problem Relation Age of Onset   Peripheral vascular disease Father    Prostate cancer Brother    CAD Mother    Hypertension Mother    Breast cancer Sister 8    Social History   Socioeconomic History   Marital status: Widowed    Spouse name: Not on file   Number of children: Not on file   Years of education: Not on file   Highest education level: Not on file  Occupational History   Not on file  Tobacco Use   Smoking status: Never   Smokeless tobacco: Never  Substance and Sexual Activity   Alcohol use: No   Drug use: No   Sexual  activity: Not on file  Other Topics Concern   Not on file  Social History Narrative   Not on file   Social Determinants of Health   Financial Resource Strain: Low Risk    Difficulty of Paying Living Expenses: Not hard at all  Food Insecurity: No Food Insecurity   Worried About Running Out of Food in the Last Year: Never true   [place] in the Last Year: Never true  Transportation Needs: No Transportation Needs   Lack of Transportation (Medical): No   Lack of Transportation (Non-Medical): No  Physical Activity: Insufficiently Active   Days of Exercise per Week: 2 days   Minutes of Exercise per Session: 30 min  Stress: No Stress Concern Present   Feeling of Stress : Not at all  Social Connections: Moderately Integrated   Frequency of Communication with Friends and Family: More than three times a week   Frequency of Social Gatherings with Friends and Family: More than three times a week   Attends Religious Services: More than 4 times per year   Active Member of [employer] or Organizations: Yes   Attends Archivist Meetings: More than 4 times per year   Marital Status: Widowed  Intimate Partner Violence: Not At Risk   Fear of Current or Ex-Partner: No   Emotionally Abused: No  Physically Abused: No   Sexually Abused: No     Current Outpatient Medications:    allopurinol (ZYLOPRIM) 100 MG tablet, Take 1 tablet (100 mg total) by mouth daily., Disp: 30 tablet, Rfl: 6   ALPRAZolam (XANAX) 0.25 MG tablet, Take 1 tablet (0.25 mg total) by mouth at bedtime., Disp: 30 tablet, Rfl: 0   amLODipine (NORVASC) 5 MG tablet, Take 1 tablet (5 mg total) by mouth 2 (two) times daily., Disp: 180 tablet, Rfl: 1   aspirin EC 81 MG tablet, Take 81 mg by mouth daily., Disp: , Rfl:    cyanocobalamin (,VITAMIN B-12,) 1000 MCG/ML injection, INJECT 0.5 MLS (500 MCG TOTAL) INTO THE MUSCLE EVERY 30 (THIRTY) DAYS., Disp: 3 mL, Rfl: 1   hydrochlorothiazide (HYDRODIURIL) 25 MG tablet, TAKE 1  TABLET BY MOUTH EVERY DAY, Disp: 90 tablet, Rfl: 3   metoprolol tartrate (LOPRESSOR) 100 MG tablet, TAKE 1 TABLET BY MOUTH TWICE A DAY, Disp: 180 tablet, Rfl: 4   MITIGARE 0.6 MG CAPS, TAKE 1 TABLET BY MOUTH 2 TIMES DAILY. (COVERS MITIGARE), Disp: 60 capsule, Rfl: 6   nitroGLYCERIN (NITROSTAT) 0.4 MG SL tablet, Place 1 tablet (0.4 mg total) under the tongue every 5 (five) minutes as needed for chest pain., Disp: 50 tablet, Rfl: 3   ondansetron (ZOFRAN ODT) 4 MG disintegrating tablet, Take 1 tablet (4 mg total) by mouth every 8 (eight) hours as needed for nausea or vomiting., Disp: 20 tablet, Rfl: 0   pantoprazole (PROTONIX) 20 MG tablet, TAKE 1 TABLET BY MOUTH EVERY DAY, Disp: 90 tablet, Rfl: 1   pravastatin (PRAVACHOL) 20 MG tablet, Take 1 tablet (20 mg total) by mouth every evening., Disp: 90 tablet, Rfl: 3   valsartan (DIOVAN) 80 MG tablet, TAKE 1 TABLET BY MOUTH 2 TIMES DAILY., Disp: 180 tablet, Rfl: 2   Allergies  Allergen Reactions   Codeine Nausea And Vomiting    ROS Review of Systems  Constitutional: Negative.   HENT: Negative.    Eyes: Negative.   Respiratory: Negative.    Cardiovascular: Negative.   Gastrointestinal: Negative.   Endocrine: Negative.   Genitourinary: Negative.   Musculoskeletal: Negative.   Skin: Negative.   Allergic/Immunologic: Negative.   Neurological: Negative.   Hematological: Negative.   Psychiatric/Behavioral: Negative.    All other systems reviewed and are negative.    Objective:    Physical Exam Vitals reviewed.  Constitutional:      Appearance: Normal appearance.  HENT:     Mouth/Throat:     Mouth: Mucous membranes are moist.  Eyes:     Pupils: Pupils are equal, round, and reactive to light.  Neck:     Vascular: No carotid bruit.  Cardiovascular:     Rate and Rhythm: Normal rate and regular rhythm.     Pulses: Normal pulses.     Heart sounds: Normal heart sounds.  Pulmonary:     Effort: Pulmonary effort is normal.     Breath  sounds: Normal breath sounds.  Abdominal:     General: Bowel sounds are normal.     Palpations: Abdomen is soft. There is no hepatomegaly, splenomegaly or mass.     Tenderness: There is no abdominal tenderness.     Hernia: No hernia is present.  Musculoskeletal:        General: No tenderness.     Cervical back: Neck supple.     Right lower leg: No edema.     Left lower leg: No edema.  Skin:    Findings:  No rash.  Neurological:     Mental Status: She is alert and oriented to person, place, and time.     Motor: No weakness.  Psychiatric:        Mood and Affect: Mood and affect normal.        Behavior: Behavior normal.    BP 140/80    Pulse 88    Ht '4\' 10"'  (1.473 m)    Wt 159 lb 1.6 oz (72.2 kg)    BMI 33.25 kg/m  Wt Readings from Last 3 Encounters:  04/23/21 159 lb 1.6 oz (72.2 kg)  12/24/20 163 lb (73.9 kg)  09/11/20 165 lb (74.8 kg)     Health Maintenance Due  Topic Date Due   Hepatitis C Screening  Never done   Pneumonia Vaccine 32+ Years old (2 - PCV) 02/21/2016    There are no preventive care reminders to display for this patient.  Lab Results  Component Value Date   TSH 1.89 12/19/2020   Lab Results  Component Value Date   WBC 4.2 12/19/2020   HGB 12.9 12/19/2020   HCT 38.6 12/19/2020   MCV 93.7 12/19/2020   PLT 228 12/19/2020   Lab Results  Component Value Date   NA 137 12/19/2020   K 4.1 12/19/2020   CO2 29 12/19/2020   GLUCOSE 91 12/19/2020   BUN 18 12/19/2020   CREATININE 1.33 (H) 12/19/2020   BILITOT 0.5 12/19/2020   ALKPHOS 90 01/28/2016   AST 17 12/19/2020   ALT 13 12/19/2020   PROT 8.5 (H) 12/19/2020   ALBUMIN 3.9 01/28/2016   CALCIUM 9.4 12/19/2020   ANIONGAP 10 04/09/2017   EGFR 41 (L) 12/19/2020   Lab Results  Component Value Date   CHOL 192 12/19/2020   Lab Results  Component Value Date   HDL 66 12/19/2020   Lab Results  Component Value Date   LDLCALC 102 (H) 12/19/2020   Lab Results  Component Value Date   TRIG 142  12/19/2020   Lab Results  Component Value Date   CHOLHDL 2.9 12/19/2020   Lab Results  Component Value Date   HGBA1C 5.8 (H) 10/13/2019      Assessment & Plan:   Problem List Items Addressed This Visit       Cardiovascular and Mediastinum   Primary hypertension - Primary     Patient denies any chest pain or shortness of breath there is no history of palpitation or paroxysmal nocturnal dyspnea   patient was advised to follow low-salt low-cholesterol diet    ideally I want to keep systolic blood pressure below 130 mmHg, patient was asked to check blood pressure one times a week and give me a report on that.  Patient will be follow-up in 3 months  or earlier as needed, patient will call me back for any change in the cardiovascular symptoms Patient was advised to buy a book from local bookstore concerning blood pressure and read several chapters  every day.  This will be supplemented by some of the material we will give him from the office.  Patient should also utilize other resources like YouTube and Internet to learn more about the blood pressure and the diet.      CAD (coronary artery disease)    Stable at the present time denies any chest pain        Respiratory   Seasonal allergic rhinitis due to pollen    Take Claritin 5 mg as needed for allergy  Digestive   Gastroesophageal reflux disease without esophagitis    - The patient's GERD is stable on medication.  - Instructed the patient to avoid eating spicy and acidic foods, as well as foods high in fat. - Instructed the patient to avoid eating large meals or meals 2-3 hours prior to sleeping.        Other   Anxiety as acute reaction to exceptional stress   Relevant Medications   ALPRAZolam (XANAX) 0.25 MG tablet    Meds ordered this encounter  Medications   ALPRAZolam (XANAX) 0.25 MG tablet    Sig: Take 1 tablet (0.25 mg total) by mouth at bedtime.    Dispense:  30 tablet    Refill:  0    Follow-up: No  follow-ups on file.    Cletis Athens, MD

## 2021-05-06 ENCOUNTER — Other Ambulatory Visit: Payer: Self-pay | Admitting: *Deleted

## 2021-05-06 MED ORDER — PRAVASTATIN SODIUM 20 MG PO TABS: 20.0000 mg | ORAL_TABLET | Freq: Every evening | ORAL | 3 refills | Status: DC

## 2021-06-13 DIAGNOSIS — H35033 Hypertensive retinopathy, bilateral: Secondary | ICD-10-CM | POA: Diagnosis not present

## 2021-07-15 ENCOUNTER — Other Ambulatory Visit: Payer: Self-pay | Admitting: Internal Medicine

## 2021-08-06 ENCOUNTER — Other Ambulatory Visit: Payer: Self-pay | Admitting: *Deleted

## 2021-08-06 DIAGNOSIS — F411 Generalized anxiety disorder: Secondary | ICD-10-CM

## 2021-08-06 MED ORDER — ALPRAZOLAM 0.25 MG PO TABS: 0.2500 mg | ORAL_TABLET | Freq: Every day | ORAL | 0 refills | Status: DC

## 2021-08-08 ENCOUNTER — Other Ambulatory Visit: Payer: Self-pay | Admitting: Internal Medicine

## 2021-08-08 DIAGNOSIS — F411 Generalized anxiety disorder: Secondary | ICD-10-CM

## 2021-08-09 ENCOUNTER — Ambulatory Visit (INDEPENDENT_AMBULATORY_CARE_PROVIDER_SITE_OTHER): Payer: Medicare PPO | Admitting: Nurse Practitioner

## 2021-08-09 ENCOUNTER — Encounter: Payer: Self-pay | Admitting: Nurse Practitioner

## 2021-08-09 VITALS — BP 167/74 | HR 56 | Ht <= 58 in | Wt 157.7 lb

## 2021-08-09 DIAGNOSIS — E669 Obesity, unspecified: Secondary | ICD-10-CM | POA: Diagnosis not present

## 2021-08-09 DIAGNOSIS — F411 Generalized anxiety disorder: Secondary | ICD-10-CM | POA: Diagnosis not present

## 2021-08-09 DIAGNOSIS — F430 Acute stress reaction: Secondary | ICD-10-CM

## 2021-08-09 DIAGNOSIS — K219 Gastro-esophageal reflux disease without esophagitis: Secondary | ICD-10-CM | POA: Diagnosis not present

## 2021-08-09 DIAGNOSIS — I10 Essential (primary) hypertension: Secondary | ICD-10-CM

## 2021-08-09 MED ORDER — ALPRAZOLAM 0.25 MG PO TABS: 0.2500 mg | ORAL_TABLET | Freq: Every day | ORAL | 1 refills | Status: DC

## 2021-08-09 NOTE — Assessment & Plan Note (Signed)
BMI 32.96 ?Advised pt to lose weight. ?Advised patient to avoid trans fat, fatty and fried food. ?Follow a regular physical activity schedule. ?Went over the risk of chronic diseases with increased weight.   ? ? ? ?

## 2021-08-09 NOTE — Progress Notes (Signed)
? ?Established Patient Office Visit ? ?Subjective:  ?Patient ID: Natasha Murray, female    DOB: 1940-05-03  Age: 81 y.o. MRN: [id number] ? ?CC:  ?Chief Complaint  ?Patient presents with  ? Medication Refill  ? ? ?HPI ? ?Natasha Murray presents for refill  her medication refill  and follow up on her chronic disease conditions. She is overall doing fine. No concerns at present.  ? ?HPI  ? ?Past Medical History:  ?Diagnosis Date  ? CAD (coronary artery disease)   ? GERD (gastroesophageal reflux disease)   ? HLD (hyperlipidemia)   ? Hypertension   ? MI, old 59  ? Myocardial infarction ([place]) 10/14/2011  ? ? ?Past Surgical History:  ?Procedure Laterality Date  ? ABDOMINAL HYSTERECTOMY    ? BREAST EXCISIONAL BIOPSY Left   ? surgical bx neg ? late 1990's  ? COLONOSCOPY WITH PROPOFOL N/A 03/13/2016  ? Procedure: COLONOSCOPY WITH PROPOFOL;  Surgeon: Lucilla Lame, MD;  Location: [facility];  Service: Endoscopy;  Laterality: N/A;  ? CORONARY ARTERY BYPASS GRAFT  10/15/2011  ? 5 vessel. [facility]  ? ESOPHAGOGASTRODUODENOSCOPY (EGD) WITH PROPOFOL N/A 03/13/2016  ? Procedure: ESOPHAGOGASTRODUODENOSCOPY (EGD) WITH PROPOFOL;  Surgeon: Lucilla Lame, MD;  Location: [facility];  Service: Endoscopy;  Laterality: N/A;  ? POLYPECTOMY N/A 03/13/2016  ? Procedure: POLYPECTOMY;  Surgeon: Lucilla Lame, MD;  Location: [facility];  Service: Endoscopy;  Laterality: N/A;  ? ? ?Family History  ?Problem Relation Age of Onset  ? Peripheral vascular disease Father   ? Prostate cancer Brother   ? CAD Mother   ? Hypertension Mother   ? Breast cancer Sister 88  ? ? ?Social History  ? ?Socioeconomic History  ? Marital status: Widowed  ?  Spouse name: Not on file  ? Number of children: Not on file  ? Years of education: Not on file  ? Highest education level: Not on file  ?Occupational History  ? Not on file  ?Tobacco Use  ? Smoking status: Never  ? Smokeless tobacco: Never  ?Substance and Sexual Activity  ? Alcohol use: No  ? Drug use:  No  ? Sexual activity: Not on file  ?Other Topics Concern  ? Not on file  ?Social History Narrative  ? Not on file  ? ?Social Determinants of Health  ? ?Financial Resource Strain: Low Risk   ? Difficulty of Paying Living Expenses: Not hard at all  ?Food Insecurity: No Food Insecurity  ? Worried About Charity fundraiser in the Last Year: Never true  ? Ran Out of Food in the Last Year: Never true  ?Transportation Needs: No Transportation Needs  ? Lack of Transportation (Medical): No  ? Lack of Transportation (Non-Medical): No  ?Physical Activity: Insufficiently Active  ? Days of Exercise per Week: 2 days  ? Minutes of Exercise per Session: 30 min  ?Stress: No Stress Concern Present  ? Feeling of Stress : Not at all  ?Social Connections: Moderately Integrated  ? Frequency of Communication with Friends and Family: More than three times a week  ? Frequency of Social Gatherings with Friends and Family: More than three times a week  ? Attends Religious Services: More than 4 times per year  ? Active Member of Clubs or Organizations: Yes  ? Attends Archivist Meetings: More than 4 times per year  ? Marital Status: Widowed  ?Intimate Partner Violence: Not At Risk  ? Fear of Current or Ex-Partner: No  ? Emotionally  Abused: No  ? Physically Abused: No  ? Sexually Abused: No  ? ? ? ?Outpatient Medications Prior to Visit  ?Medication Sig Dispense Refill  ? amLODipine (NORVASC) 5 MG tablet Take 1 tablet (5 mg total) by mouth 2 (two) times daily. 180 tablet 1  ? aspirin EC 81 MG tablet Take 81 mg by mouth daily.    ? hydrochlorothiazide (HYDRODIURIL) 25 MG tablet TAKE 1 TABLET BY MOUTH EVERY DAY 90 tablet 3  ? metoprolol tartrate (LOPRESSOR) 100 MG tablet TAKE 1 TABLET BY MOUTH TWICE A DAY 180 tablet 4  ? MITIGARE 0.6 MG CAPS TAKE 1 TABLET BY MOUTH 2 TIMES DAILY. (COVERS MITIGARE) 60 capsule 6  ? nitroGLYCERIN (NITROSTAT) 0.4 MG SL tablet Place 1 tablet (0.4 mg total) under the tongue every 5 (five) minutes as needed for  chest pain. 50 tablet 3  ? pantoprazole (PROTONIX) 20 MG tablet TAKE 1 TABLET BY MOUTH EVERY DAY 90 tablet 1  ? pravastatin (PRAVACHOL) 20 MG tablet Take 1 tablet (20 mg total) by mouth every evening. 90 tablet 3  ? valsartan (DIOVAN) 80 MG tablet Take 1 tablet (80 mg total) by mouth 2 (two) times daily. 180 tablet 2  ? ALPRAZolam (XANAX) 0.25 MG tablet Take 1 tablet (0.25 mg total) by mouth at bedtime. 30 tablet 0  ? allopurinol (ZYLOPRIM) 100 MG tablet Take 1 tablet (100 mg total) by mouth daily. (Patient not taking: Reported on 08/09/2021) 30 tablet 6  ? cyanocobalamin (,VITAMIN B-12,) 1000 MCG/ML injection INJECT 0.5 MLS (500 MCG TOTAL) INTO THE MUSCLE EVERY 30 (THIRTY) DAYS. (Patient not taking: Reported on 08/09/2021) 3 mL 1  ? ondansetron (ZOFRAN ODT) 4 MG disintegrating tablet Take 1 tablet (4 mg total) by mouth every 8 (eight) hours as needed for nausea or vomiting. (Patient not taking: Reported on 08/09/2021) 20 tablet 0  ? ?No facility-administered medications prior to visit.  ? ? ?Allergies  ?Allergen Reactions  ? Codeine Nausea And Vomiting  ? ? ?ROS ?Review of Systems  ?Constitutional:  Negative for activity change and fatigue.  ?HENT:  Negative for congestion, ear discharge, nosebleeds and sore throat.   ?Eyes:  Negative for discharge and redness.  ?Respiratory:  Negative for apnea, shortness of breath and wheezing.   ?Cardiovascular:  Negative for chest pain and palpitations.  ?Gastrointestinal:  Negative for abdominal distention and blood in stool.  ?Genitourinary:  Negative for decreased urine volume, difficulty urinating and flank pain.  ?Musculoskeletal:  Negative for arthralgias and joint swelling.  ?Skin:  Negative for color change and wound.  ?Neurological:  Negative for dizziness, facial asymmetry, light-headedness and headaches.  ?Psychiatric/Behavioral:  Negative for agitation, behavioral problems, confusion and decreased concentration.   ? ?  ?Objective:  ?  ?Physical Exam ?Constitutional:   ?    Appearance: Normal appearance. She is obese.  ?HENT:  ?   Head: Normocephalic and atraumatic.  ?   Right Ear: Tympanic membrane normal.  ?   Left Ear: Tympanic membrane normal.  ?   Nose: Nose normal.  ?   Mouth/Throat:  ?   Mouth: Mucous membranes are moist.  ?Eyes:  ?   Extraocular Movements: Extraocular movements intact.  ?   Conjunctiva/sclera: Conjunctivae normal.  ?   Pupils: Pupils are equal, round, and reactive to light.  ?Cardiovascular:  ?   Rate and Rhythm: Normal rate and regular rhythm.  ?   Pulses: Normal pulses.  ?   Heart sounds: Normal heart sounds.  ?Pulmonary:  ?  Effort: Pulmonary effort is normal.  ?   Breath sounds: Normal breath sounds.  ?Abdominal:  ?   General: Bowel sounds are normal.  ?   Palpations: Abdomen is soft.  ?Musculoskeletal:     ?   General: Normal range of motion.  ?   Cervical back: Normal range of motion.  ?Skin: ?   General: Skin is warm.  ?   Capillary Refill: Capillary refill takes less than 2 seconds.  ?Neurological:  ?   General: No focal deficit present.  ?   Mental Status: She is alert and oriented to person, place, and time. Mental status is at baseline.  ?Psychiatric:     ?   Mood and Affect: Mood normal.     ?   Behavior: Behavior normal.     ?   Thought Content: Thought content normal.     ?   Judgment: Judgment normal.  ? ? ?BP (!) 167/74   Pulse (!) 56   Ht '4\' 10"'$  (1.473 m)   Wt 157 lb 11.2 oz (71.5 kg)   BMI 32.96 kg/m?  ?Wt Readings from Last 3 Encounters:  ?08/09/21 157 lb 11.2 oz (71.5 kg)  ?04/23/21 159 lb 1.6 oz (72.2 kg)  ?12/24/20 163 lb (73.9 kg)  ? ? ? ?Health Maintenance Due  ?Topic Date Due  ? Pneumonia Vaccine 52+ Years old (2 - PCV) 02/21/2016  ? Zoster Vaccines- Shingrix (2 of 2) 10/14/2018  ? COVID-19 Vaccine (5 - Booster for Pfizer series) 09/22/2020  ? ? ?There are no preventive care reminders to display for this patient. ? ?Lab Results  ?Component Value Date  ? TSH 1.89 12/19/2020  ? ?Lab Results  ?Component Value Date  ? WBC 4.2  12/19/2020  ? HGB 12.9 12/19/2020  ? HCT 38.6 12/19/2020  ? MCV 93.7 12/19/2020  ? PLT 228 12/19/2020  ? ?Lab Results  ?Component Value Date  ? NA 137 12/19/2020  ? K 4.1 12/19/2020  ? CO2 29 12/19/2020  ? GLUC

## 2021-08-09 NOTE — Assessment & Plan Note (Signed)
Stable with medication.

## 2021-08-09 NOTE — Assessment & Plan Note (Signed)
Stable with medication. ?Advised patient to eat dinner 2-3 hour before going to bed. ?Cut down on fatty and fried food.  ?

## 2021-08-09 NOTE — Assessment & Plan Note (Signed)
BP 167/74 in the office today. ?Advised pt to consume low salt and heart healthy diet. ?Continue amlodipine, metoprolol, valsartan and HCTZ. ?

## 2021-08-12 ENCOUNTER — Ambulatory Visit: Payer: Medicare PPO | Admitting: *Deleted

## 2021-08-12 DIAGNOSIS — E782 Mixed hyperlipidemia: Secondary | ICD-10-CM

## 2021-08-12 DIAGNOSIS — I10 Essential (primary) hypertension: Secondary | ICD-10-CM | POA: Diagnosis not present

## 2021-08-12 DIAGNOSIS — R531 Weakness: Secondary | ICD-10-CM

## 2021-08-13 LAB — CBC WITH DIFFERENTIAL/PLATELET
Absolute Monocytes: 396 cells/uL (ref 200–950)
Basophils Absolute: 11 cells/uL (ref 0–200)
Basophils Relative: 0.3 %
Eosinophils Absolute: 140 cells/uL (ref 15–500)
Eosinophils Relative: 3.9 %
HCT: 37.5 % (ref 35.0–45.0)
Hemoglobin: 12.6 g/dL (ref 11.7–15.5)
Lymphs Abs: 1300 cells/uL (ref 850–3900)
MCH: 31.5 pg (ref 27.0–33.0)
MCHC: 33.6 g/dL (ref 32.0–36.0)
MCV: 93.8 fL (ref 80.0–100.0)
MPV: 11.5 fL (ref 7.5–12.5)
Monocytes Relative: 11 %
Neutro Abs: 1753 cells/uL (ref 1500–7800)
Neutrophils Relative %: 48.7 %
Platelets: 228 10*3/uL (ref 140–400)
RBC: 4 10*6/uL (ref 3.80–5.10)
RDW: 13.7 % (ref 11.0–15.0)
Total Lymphocyte: 36.1 %
WBC: 3.6 10*3/uL — ABNORMAL LOW (ref 3.8–10.8)

## 2021-08-13 LAB — COMPLETE METABOLIC PANEL WITH GFR
AG Ratio: 0.9 (calc) — ABNORMAL LOW (ref 1.0–2.5)
ALT: 14 U/L (ref 6–29)
AST: 18 U/L (ref 10–35)
Albumin: 3.7 g/dL (ref 3.6–5.1)
Alkaline phosphatase (APISO): 91 U/L (ref 37–153)
BUN/Creatinine Ratio: 16 (calc) (ref 6–22)
BUN: 25 mg/dL (ref 7–25)
CO2: 26 mmol/L (ref 20–32)
Calcium: 8.9 mg/dL (ref 8.6–10.4)
Chloride: 99 mmol/L (ref 98–110)
Creat: 1.6 mg/dL — ABNORMAL HIGH (ref 0.60–0.95)
Globulin: 4.3 g/dL (calc) — ABNORMAL HIGH (ref 1.9–3.7)
Glucose, Bld: 90 mg/dL (ref 65–99)
Potassium: 3.5 mmol/L (ref 3.5–5.3)
Sodium: 137 mmol/L (ref 135–146)
Total Bilirubin: 0.4 mg/dL (ref 0.2–1.2)
Total Protein: 8 g/dL (ref 6.1–8.1)
eGFR: 32 mL/min/{1.73_m2} — ABNORMAL LOW (ref >=60)

## 2021-08-13 LAB — LIPID PANEL
Cholesterol: 182 mg/dL (ref <200)
HDL: 65 mg/dL (ref >=50)
LDL Cholesterol (Calc): 96 mg/dL (calc)
Non-HDL Cholesterol (Calc): 117 mg/dL (calc) (ref <130)
Total CHOL/HDL Ratio: 2.8 (calc) (ref <5.0)
Triglycerides: 113 mg/dL (ref <150)

## 2021-08-13 LAB — TSH: TSH: 1.72 mIU/L (ref 0.40–4.50)

## 2021-08-26 NOTE — Progress Notes (Signed)
Please call pt with the abnormal result and make a follow up appointment.

## 2021-08-30 ENCOUNTER — Encounter: Payer: Self-pay | Admitting: Nurse Practitioner

## 2021-08-30 ENCOUNTER — Ambulatory Visit (INDEPENDENT_AMBULATORY_CARE_PROVIDER_SITE_OTHER): Payer: Medicare PPO | Admitting: Nurse Practitioner

## 2021-08-30 VITALS — BP 160/70 | HR 54 | Ht <= 58 in | Wt 161.5 lb

## 2021-08-30 DIAGNOSIS — N1832 Chronic kidney disease, stage 3b: Secondary | ICD-10-CM | POA: Insufficient documentation

## 2021-08-30 DIAGNOSIS — E669 Obesity, unspecified: Secondary | ICD-10-CM

## 2021-08-30 DIAGNOSIS — I10 Essential (primary) hypertension: Secondary | ICD-10-CM

## 2021-08-30 NOTE — Assessment & Plan Note (Signed)
Patient repeat BP 160/70 in the office today. Advised patient to check her BP at home and call the office with the readings on Wednesday. Would adjust the medication if BP not controlled.  Continue taking amlodipine 5 mg twice a day, HCTZ 25 mg, Metroprolol 100 mg twice a day and valsartan 80 mg daily.

## 2021-08-30 NOTE — Progress Notes (Signed)
Established Patient Office Visit  Subjective:  Patient ID: Natasha Murray, female    DOB: [birth year]  Age: 81 y.o. MRN: [id number]  CC:  Chief Complaint  Patient presents with   Lab Results     HPI  Natasha Murray presents for lab review. She is taking all her medication. No new concerns at present.  HPI   Past Medical History:  Diagnosis Date   CAD (coronary artery disease)    GERD (gastroesophageal reflux disease)    HLD (hyperlipidemia)    Hypertension    MI, old 1995   Myocardial infarction ([place]) 10/14/2011    Past Surgical History:  Procedure Laterality Date   ABDOMINAL HYSTERECTOMY     BREAST EXCISIONAL BIOPSY Left    surgical bx neg ? late 1990's   COLONOSCOPY WITH PROPOFOL N/A 03/13/2016   Procedure: COLONOSCOPY WITH PROPOFOL;  Surgeon: Lucilla Lame, MD;  Location: [place];  Service: Endoscopy;  Laterality: N/A;   CORONARY ARTERY BYPASS GRAFT  10/15/2011   5 vessel. [facility]   ESOPHAGOGASTRODUODENOSCOPY (EGD) WITH PROPOFOL N/A 03/13/2016   Procedure: ESOPHAGOGASTRODUODENOSCOPY (EGD) WITH PROPOFOL;  Surgeon: Lucilla Lame, MD;  Location: [place];  Service: Endoscopy;  Laterality: N/A;   POLYPECTOMY N/A 03/13/2016   Procedure: POLYPECTOMY;  Surgeon: Lucilla Lame, MD;  Location: [place];  Service: Endoscopy;  Laterality: N/A;    Family History  Problem Relation Age of Onset   Peripheral vascular disease Father    Prostate cancer Brother    CAD Mother    Hypertension Mother    Breast cancer Sister 56    Social History   Socioeconomic History   Marital status: Widowed    Spouse name: Not on file   Number of children: Not on file   Years of education: Not on file   Highest education level: Not on file  Occupational History   Not on file  Tobacco Use   Smoking status: Never   Smokeless tobacco: Never  Substance and Sexual Activity   Alcohol use: No   Drug use: No   Sexual activity: Not on file  Other Topics Concern    Not on file  Social History Narrative   Not on file   Social Determinants of Health   Financial Resource Strain: Low Risk    Difficulty of Paying Living Expenses: Not hard at all  Food Insecurity: No Food Insecurity   Worried About Running Out of Food in the Last Year: Never true   [place] in the Last Year: Never true  Transportation Needs: No Transportation Needs   Lack of Transportation (Medical): No   Lack of Transportation (Non-Medical): No  Physical Activity: Insufficiently Active   Days of Exercise per Week: 2 days   Minutes of Exercise per Session: 30 min  Stress: No Stress Concern Present   Feeling of Stress : Not at all  Social Connections: Moderately Integrated   Frequency of Communication with Friends and Family: More than three times a week   Frequency of Social Gatherings with Friends and Family: More than three times a week   Attends Religious Services: More than 4 times per year   Active Member of [employer] or Organizations: Yes   Attends Archivist Meetings: More than 4 times per year   Marital Status: Widowed  Intimate Partner Violence: Not At Risk   Fear of Current or Ex-Partner: No   Emotionally Abused: No   Physically Abused: No   Sexually  Abused: No     Outpatient Medications Prior to Visit  Medication Sig Dispense Refill   ALPRAZolam (XANAX) 0.25 MG tablet Take 1 tablet (0.25 mg total) by mouth at bedtime. 30 tablet 1   amLODipine (NORVASC) 5 MG tablet Take 1 tablet (5 mg total) by mouth 2 (two) times daily. 180 tablet 1   aspirin EC 81 MG tablet Take 81 mg by mouth daily.     hydrochlorothiazide (HYDRODIURIL) 25 MG tablet TAKE 1 TABLET BY MOUTH EVERY DAY 90 tablet 3   metoprolol tartrate (LOPRESSOR) 100 MG tablet TAKE 1 TABLET BY MOUTH TWICE A DAY 180 tablet 4   MITIGARE 0.6 MG CAPS TAKE 1 TABLET BY MOUTH 2 TIMES DAILY. (COVERS MITIGARE) 60 capsule 6   nitroGLYCERIN (NITROSTAT) 0.4 MG SL tablet Place 1 tablet (0.4 mg total) under the  tongue every 5 (five) minutes as needed for chest pain. 50 tablet 3   pantoprazole (PROTONIX) 20 MG tablet TAKE 1 TABLET BY MOUTH EVERY DAY 90 tablet 1   pravastatin (PRAVACHOL) 20 MG tablet Take 1 tablet (20 mg total) by mouth every evening. 90 tablet 3   valsartan (DIOVAN) 80 MG tablet Take 1 tablet (80 mg total) by mouth 2 (two) times daily. 180 tablet 2   No facility-administered medications prior to visit.    Allergies  Allergen Reactions   Codeine Nausea And Vomiting    ROS Review of Systems  Constitutional:  Negative for activity change, appetite change and fatigue.  HENT:  Negative for congestion, ear discharge and nosebleeds.   Eyes:  Negative for discharge and itching.  Respiratory:  Negative for apnea and shortness of breath.   Cardiovascular:  Negative for chest pain and leg swelling.  Gastrointestinal:  Negative for abdominal distention, abdominal pain and rectal pain.  Genitourinary:  Negative for difficulty urinating, dyspareunia and dysuria.  Musculoskeletal:  Negative for arthralgias, back pain and gait problem.  Skin:  Negative for color change, pallor and rash.  Neurological:  Negative for dizziness, facial asymmetry and headaches.  Psychiatric/Behavioral:  Negative for agitation, behavioral problems and confusion.      Objective:    Physical Exam Constitutional:      Appearance: Normal appearance. She is obese.  HENT:     Head: Normocephalic and atraumatic.     Right Ear: Tympanic membrane normal.     Left Ear: Tympanic membrane normal.     Nose: Nose normal.     Mouth/Throat:     Mouth: Mucous membranes are moist.  Eyes:     Extraocular Movements: Extraocular movements intact.     Conjunctiva/sclera: Conjunctivae normal.     Pupils: Pupils are equal, round, and reactive to light.  Cardiovascular:     Rate and Rhythm: Normal rate.  Pulmonary:     Effort: Pulmonary effort is normal.     Breath sounds: Normal breath sounds.  Abdominal:     General:  Bowel sounds are normal.     Palpations: Abdomen is soft.  Neurological:     Mental Status: She is alert.    BP (!) 160/70   Pulse (!) 54   Ht _0  (1.473 m)   Wt 161 lb 8 oz (73.3 kg)   BMI 33.75 kg/m  Wt Readings from Last 3 Encounters:  08/30/21 161 lb 8 oz (73.3 kg)  08/09/21 157 lb 11.2 oz (71.5 kg)  04/23/21 159 lb 1.6 oz (72.2 kg)     Health Maintenance Due  Topic Date Due  Pneumonia Vaccine 46+ Years old (2 - PCV) 02/21/2016   Zoster Vaccines- Shingrix (2 of 2) 10/14/2018   COVID-19 Vaccine (5 - Booster for Pfizer series) 09/22/2020    There are no preventive care reminders to display for this patient.  Lab Results  Component Value Date   TSH 1.72 08/12/2021   Lab Results  Component Value Date   WBC 3.6 (L) 08/12/2021   HGB 12.6 08/12/2021   HCT 37.5 08/12/2021   MCV 93.8 08/12/2021   PLT 228 08/12/2021   Lab Results  Component Value Date   NA 137 08/12/2021   K 3.5 08/12/2021   CO2 26 08/12/2021   GLUCOSE 90 08/12/2021   BUN 25 08/12/2021   CREATININE 1.60 (H) 08/12/2021   BILITOT 0.4 08/12/2021   ALKPHOS 90 01/28/2016   AST 18 08/12/2021   ALT 14 08/12/2021   PROT 8.0 08/12/2021   ALBUMIN 3.9 01/28/2016   CALCIUM 8.9 08/12/2021   ANIONGAP 10 04/09/2017   EGFR 32 (L) 08/12/2021   Lab Results  Component Value Date   CHOL 182 08/12/2021   Lab Results  Component Value Date   HDL 65 08/12/2021   Lab Results  Component Value Date   LDLCALC 96 08/12/2021   Lab Results  Component Value Date   TRIG 113 08/12/2021   Lab Results  Component Value Date   CHOLHDL 2.8 08/12/2021   Lab Results  Component Value Date   HGBA1C 5.8 (H) 10/13/2019      Assessment & Plan:   Problem List Items Addressed This Visit       Cardiovascular and Mediastinum   Primary hypertension    Patient repeat BP 160/70 in the office today. Advised patient to check her BP at home and call the office with the readings on Wednesday. Would adjust the  medication if BP not controlled.  Continue taking amlodipine 5 mg twice a day, HCTZ 25 mg, Metroprolol 100 mg twice a day and valsartan 80 mg daily.         Genitourinary   Stage 3b chronic kidney disease (CKD) ([place]) - Primary    Patient creatinine 1.6 and eGFR 32 on 08/12/2021, her previous creatinine was 1.3 and eGFR 41 on 12/19/20 Would refer the patient to the Nephrologist for consult.         Other   Obesity (BMI 30.0-34.9)    BMI 33.75 in the office today. Advised patient to consume heart healthy diet and follow regular exercise routine.           No orders of the defined types were placed in this encounter.    Follow-up: No follow-ups on file.    Theresia Lo, NP

## 2021-08-30 NOTE — Assessment & Plan Note (Signed)
Patient creatinine 1.6 and eGFR 32 on 08/12/2021, her previous creatinine was 1.3 and eGFR 41 on 12/19/20 Would refer the patient to the Nephrologist for consult.

## 2021-08-30 NOTE — Assessment & Plan Note (Signed)
BMI 33.75 in the office today. Advised patient to consume heart healthy diet and follow regular exercise routine.

## 2021-09-16 ENCOUNTER — Other Ambulatory Visit: Payer: Self-pay | Admitting: Internal Medicine

## 2021-09-16 DIAGNOSIS — Z1231 Encounter for screening mammogram for malignant neoplasm of breast: Secondary | ICD-10-CM

## 2021-10-22 ENCOUNTER — Other Ambulatory Visit: Payer: Self-pay | Admitting: Internal Medicine

## 2021-10-22 DIAGNOSIS — N1832 Chronic kidney disease, stage 3b: Secondary | ICD-10-CM

## 2021-10-24 ENCOUNTER — Ambulatory Visit
Admission: RE | Admit: 2021-10-24 | Discharge: 2021-10-24 | Disposition: A | Discharge status: Home / Self Care | Payer: Medicare PPO | Source: Ambulatory Visit | Attending: Internal Medicine | Admitting: Internal Medicine

## 2021-10-24 DIAGNOSIS — Z1231 Encounter for screening mammogram for malignant neoplasm of breast: Secondary | ICD-10-CM | POA: Diagnosis present

## 2021-10-30 ENCOUNTER — Encounter: Payer: Self-pay | Admitting: Internal Medicine

## 2021-10-30 ENCOUNTER — Ambulatory Visit (INDEPENDENT_AMBULATORY_CARE_PROVIDER_SITE_OTHER): Payer: Medicare PPO | Admitting: Internal Medicine

## 2021-10-30 VITALS — BP 140/80 | HR 56 | Ht <= 58 in | Wt 155.3 lb

## 2021-10-30 DIAGNOSIS — J301 Allergic rhinitis due to pollen: Secondary | ICD-10-CM | POA: Diagnosis not present

## 2021-10-30 DIAGNOSIS — N1832 Chronic kidney disease, stage 3b: Secondary | ICD-10-CM | POA: Diagnosis not present

## 2021-10-30 DIAGNOSIS — E785 Hyperlipidemia, unspecified: Secondary | ICD-10-CM

## 2021-10-30 DIAGNOSIS — K219 Gastro-esophageal reflux disease without esophagitis: Secondary | ICD-10-CM | POA: Diagnosis not present

## 2021-10-30 DIAGNOSIS — F411 Generalized anxiety disorder: Secondary | ICD-10-CM

## 2021-10-30 DIAGNOSIS — F430 Acute stress reaction: Secondary | ICD-10-CM

## 2021-10-30 DIAGNOSIS — E669 Obesity, unspecified: Secondary | ICD-10-CM

## 2021-10-30 DIAGNOSIS — E782 Mixed hyperlipidemia: Secondary | ICD-10-CM

## 2021-10-30 MED ORDER — ROSUVASTATIN CALCIUM 20 MG PO TABS: 20.0000 mg | ORAL_TABLET | Freq: Every day | ORAL | 3 refills | Status: AC

## 2021-10-30 NOTE — Assessment & Plan Note (Signed)
Behavioral modification strategies: increasing lean protein intake, decreasing simple carbohydrates, increasing vegetables, increasing water intake, decreasing eating out, no skipping meals, meal planning and cooking strategies, keeping healthy foods in the home and planning for success.

## 2021-10-30 NOTE — Assessment & Plan Note (Signed)
   Keeping a stress/anxiety diary. This can help you learn what triggers your reaction and then learn ways to manage your response.  Thinking about how you react to certain situations. You may not be able to control everything, but you can control your response.  Making time for activities that help you relax and not feeling guilty about spending your time in this way.  Visual imagery and yoga can help you stay calm and relax. 

## 2021-10-30 NOTE — Assessment & Plan Note (Signed)
Patient educated extensively on acid reflux lifestyle modification, including buying a bed wedge, not eating 3 hrs before bedtime, diet modifications, and handout given for the same.  

## 2021-10-30 NOTE — Progress Notes (Signed)
Established Patient Office Visit  Subjective:  Patient ID: Natasha Murray, female    DOB: [birth year]  Age: 81 y.o. MRN: [id number]  CC:  Chief Complaint  Patient presents with   Hypertension    Patient had labs drawn on 09/24/21 by pcp and kidney function is abnormal. Patient has been having elevated BP readings.     Hypertension The current episode started more than 1 year ago. The problem has been waxing and waning since onset. Pertinent negatives include no anxiety, blurred vision, chest pain, headaches, malaise/fatigue, orthopnea, palpitations, peripheral edema, PND, shortness of breath or sweats. There are no associated agents to hypertension. Risk factors for coronary artery disease include dyslipidemia.    Natasha Murray presents for check up  Past Medical History:  Diagnosis Date   CAD (coronary artery disease)    GERD (gastroesophageal reflux disease)    HLD (hyperlipidemia)    Hypertension    MI, old 1995   Myocardial infarction ([place]) 10/14/2011    Past Surgical History:  Procedure Laterality Date   ABDOMINAL HYSTERECTOMY     BREAST EXCISIONAL BIOPSY Left    surgical bx neg ? late 1990's   COLONOSCOPY WITH PROPOFOL N/A 03/13/2016   Procedure: COLONOSCOPY WITH PROPOFOL;  Surgeon: Lucilla Lame, MD;  Location: [facility];  Service: Endoscopy;  Laterality: N/A;   CORONARY ARTERY BYPASS GRAFT  10/15/2011   5 vessel. [facility]   ESOPHAGOGASTRODUODENOSCOPY (EGD) WITH PROPOFOL N/A 03/13/2016   Procedure: ESOPHAGOGASTRODUODENOSCOPY (EGD) WITH PROPOFOL;  Surgeon: Lucilla Lame, MD;  Location: [place];  Service: Endoscopy;  Laterality: N/A;   POLYPECTOMY N/A 03/13/2016   Procedure: POLYPECTOMY;  Surgeon: Lucilla Lame, MD;  Location: [facility];  Service: Endoscopy;  Laterality: N/A;    Family History  Problem Relation Age of Onset   Peripheral vascular disease Father    Prostate cancer Brother    CAD Mother    Hypertension Mother    Breast cancer  Sister 10    Social History   Socioeconomic History   Marital status: Widowed    Spouse name: Not on file   Number of children: Not on file   Years of education: Not on file   Highest education level: Not on file  Occupational History   Not on file  Tobacco Use   Smoking status: Never   Smokeless tobacco: Never  Substance and Sexual Activity   Alcohol use: No   Drug use: No   Sexual activity: Not on file  Other Topics Concern   Not on file  Social History Narrative   Not on file   Social Determinants of Health   Financial Resource Strain: Low Risk  (02/21/2021)   Overall Financial Resource Strain (CARDIA)    Difficulty of Paying Living Expenses: Not hard at all  Food Insecurity: No Food Insecurity (02/21/2021)   Hunger Vital Sign    Worried About Running Out of Food in the Last Year: Never true    Ran Out of Food in the Last Year: Never true  Transportation Needs: No Transportation Needs (02/21/2021)   PRAPARE - Hydrologist (Medical): No    Lack of Transportation (Non-Medical): No  Physical Activity: Insufficiently Active (02/21/2021)   Exercise Vital Sign    Days of Exercise per Week: 2 days    Minutes of Exercise per Session: 30 min  Stress: No Stress Concern Present (02/21/2021)   [place]  Feeling of Stress : Not at all  Social Connections: Moderately Integrated (02/21/2021)   Social Connection and Isolation Panel [NHANES]    Frequency of Communication with Friends and Family: More than three times a week    Frequency of Social Gatherings with Friends and Family: More than three times a week    Attends Religious Services: More than 4 times per year    Active Member of [employer] or Organizations: Yes    Attends Archivist Meetings: More than 4 times per year    Marital Status: Widowed  Intimate Partner Violence: Not At Risk (02/21/2021)   Humiliation,  Afraid, Rape, and Kick questionnaire    Fear of Current or Ex-Partner: No    Emotionally Abused: No    Physically Abused: No    Sexually Abused: No     Current Outpatient Medications:    ALPRAZolam (XANAX) 0.25 MG tablet, Take 1 tablet (0.25 mg total) by mouth at bedtime., Disp: 30 tablet, Rfl: 1   amLODipine (NORVASC) 5 MG tablet, Take 1 tablet (5 mg total) by mouth 2 (two) times daily., Disp: 180 tablet, Rfl: 1   aspirin EC 81 MG tablet, Take 81 mg by mouth daily., Disp: , Rfl:    hydrochlorothiazide (HYDRODIURIL) 25 MG tablet, TAKE 1 TABLET BY MOUTH EVERY DAY, Disp: 90 tablet, Rfl: 3   metoprolol tartrate (LOPRESSOR) 100 MG tablet, TAKE 1 TABLET BY MOUTH TWICE A DAY, Disp: 180 tablet, Rfl: 4   MITIGARE 0.6 MG CAPS, TAKE 1 TABLET BY MOUTH 2 TIMES DAILY. (COVERS MITIGARE), Disp: 60 capsule, Rfl: 6   nitroGLYCERIN (NITROSTAT) 0.4 MG SL tablet, Place 1 tablet (0.4 mg total) under the tongue every 5 (five) minutes as needed for chest pain., Disp: 50 tablet, Rfl: 3   pantoprazole (PROTONIX) 20 MG tablet, TAKE 1 TABLET BY MOUTH EVERY DAY, Disp: 90 tablet, Rfl: 1   rosuvastatin (CRESTOR) 20 MG tablet, Take 1 tablet (20 mg total) by mouth daily., Disp: 90 tablet, Rfl: 3   valsartan (DIOVAN) 80 MG tablet, Take 1 tablet (80 mg total) by mouth 2 (two) times daily., Disp: 180 tablet, Rfl: 2   Allergies  Allergen Reactions   Codeine Nausea And Vomiting    ROS Review of Systems  Constitutional:  Negative for malaise/fatigue.  Eyes:  Negative for blurred vision.  Respiratory:  Negative for shortness of breath.   Cardiovascular:  Negative for chest pain, palpitations, orthopnea and PND.  Neurological:  Negative for headaches.      Objective:    Physical Exam Vitals reviewed.  Constitutional:      Appearance: Normal appearance.  HENT:     Mouth/Throat:     Mouth: Mucous membranes are moist.  Eyes:     Pupils: Pupils are equal, round, and reactive to light.  Neck:     Vascular: No  carotid bruit.  Cardiovascular:     Rate and Rhythm: Normal rate and regular rhythm.     Pulses: Normal pulses.     Heart sounds: Normal heart sounds.  Pulmonary:     Effort: Pulmonary effort is normal.     Breath sounds: Normal breath sounds.  Abdominal:     General: Bowel sounds are normal.     Palpations: Abdomen is soft. There is no hepatomegaly, splenomegaly or mass.     Tenderness: There is no abdominal tenderness.     Hernia: No hernia is present.  Musculoskeletal:        General: No tenderness.  Cervical back: Neck supple.     Right lower leg: No edema.     Left lower leg: No edema.  Skin:    Findings: No rash.  Neurological:     Mental Status: She is alert and oriented to person, place, and time.     Motor: No weakness.  Psychiatric:        Mood and Affect: Mood and affect normal.        Behavior: Behavior normal.     BP 140/80   Pulse (!) 56   Ht '4\' 10"'  (1.473 m)   Wt 155 lb 4.8 oz (70.4 kg)   BMI 32.46 kg/m  Wt Readings from Last 3 Encounters:  10/30/21 155 lb 4.8 oz (70.4 kg)  08/30/21 161 lb 8 oz (73.3 kg)  08/09/21 157 lb 11.2 oz (71.5 kg)     Health Maintenance Due  Topic Date Due   Pneumonia Vaccine 36+ Years old (2 - PCV) 02/21/2016   Zoster Vaccines- Shingrix (2 of 2) 10/14/2018   COVID-19 Vaccine (5 - Pfizer series) 09/22/2020    There are no preventive care reminders to display for this patient.  Lab Results  Component Value Date   TSH 1.72 08/12/2021   Lab Results  Component Value Date   WBC 3.6 (L) 08/12/2021   HGB 12.6 08/12/2021   HCT 37.5 08/12/2021   MCV 93.8 08/12/2021   PLT 228 08/12/2021   Lab Results  Component Value Date   NA 137 08/12/2021   K 3.5 08/12/2021   CO2 26 08/12/2021   GLUCOSE 90 08/12/2021   BUN 25 08/12/2021   CREATININE 1.60 (H) 08/12/2021   BILITOT 0.4 08/12/2021   ALKPHOS 90 01/28/2016   AST 18 08/12/2021   ALT 14 08/12/2021   PROT 8.0 08/12/2021   ALBUMIN 3.9 01/28/2016   CALCIUM 8.9  08/12/2021   ANIONGAP 10 04/09/2017   EGFR 32 (L) 08/12/2021   Lab Results  Component Value Date   CHOL 182 08/12/2021   Lab Results  Component Value Date   HDL 65 08/12/2021   Lab Results  Component Value Date   LDLCALC 96 08/12/2021   Lab Results  Component Value Date   TRIG 113 08/12/2021   Lab Results  Component Value Date   CHOLHDL 2.8 08/12/2021   Lab Results  Component Value Date   HGBA1C 5.8 (H) 10/13/2019      Assessment & Plan:   Problem List Items Addressed This Visit       Respiratory   Seasonal allergic rhinitis due to pollen    Take Claritin 10 mg p.o. daily        Digestive   Gastroesophageal reflux disease without esophagitis    Patient educated extensively on acid reflux lifestyle modification, including buying a bed wedge, not eating 3 hrs before bedtime, diet modifications, and handout given for the same.         Genitourinary   Stage 3b chronic kidney disease (CKD) ([place])    We will get a consult from nephrologist        Other   Moderate mixed hyperlipidemia not requiring statin therapy    We will stop pravastatin and start the patient on Crestor 20 mg p.o. daily      Relevant Medications   rosuvastatin (CRESTOR) 20 MG tablet   Anxiety as acute reaction to exceptional stress     Keeping a stress/anxiety diary. This can help you learn what triggers your reaction and then learn ways  to manage your response.  Thinking about how you react to certain situations. You may not be able to control everything, but you can control your response.  Making time for activities that help you relax and not feeling guilty about spending your time in this way.  Visual imagery and yoga can help you stay calm and relax.      Obesity (BMI 30.0-34.9)    Behavioral modification strategies: increasing lean protein intake, decreasing simple carbohydrates, increasing vegetables, increasing water intake, decreasing eating out, no skipping meals, meal  planning and cooking strategies, keeping healthy foods in the home and planning for success.      Other Visit Diagnoses     Dyslipidemia    -  Primary   Relevant Medications   rosuvastatin (CRESTOR) 20 MG tablet       Meds ordered this encounter  Medications   rosuvastatin (CRESTOR) 20 MG tablet    Sig: Take 1 tablet (20 mg total) by mouth daily.    Dispense:  90 tablet    Refill:  3    Follow-up: No follow-ups on file.    Cletis Athens, MD

## 2021-10-30 NOTE — Assessment & Plan Note (Signed)
We will stop pravastatin and start the patient on Crestor 20 mg p.o. daily

## 2021-10-30 NOTE — Assessment & Plan Note (Signed)
We will get a consult from nephrologist

## 2021-10-30 NOTE — Assessment & Plan Note (Signed)
Take Claritin 10 mg p.o. daily 

## 2021-10-30 NOTE — Addendum Note (Signed)
Addended by: Lacretia Nicks L on: 10/30/2021 11:41 AM   Modules accepted: Orders

## 2021-11-01 ENCOUNTER — Ambulatory Visit
Admission: RE | Admit: 2021-11-01 | Discharge: 2021-11-01 | Disposition: A | Discharge status: Home / Self Care | Payer: Medicare PPO | Source: Ambulatory Visit | Attending: Internal Medicine | Admitting: Internal Medicine

## 2021-11-01 DIAGNOSIS — N1832 Chronic kidney disease, stage 3b: Secondary | ICD-10-CM | POA: Diagnosis present

## 2021-11-03 ENCOUNTER — Other Ambulatory Visit: Payer: Self-pay | Admitting: Internal Medicine

## 2021-11-12 ENCOUNTER — Other Ambulatory Visit: Payer: Self-pay

## 2021-11-27 ENCOUNTER — Encounter: Payer: Self-pay | Admitting: Internal Medicine

## 2021-11-27 ENCOUNTER — Ambulatory Visit: Payer: Medicare PPO | Admitting: Internal Medicine

## 2021-11-27 VITALS — BP 130/76 | HR 64 | Ht <= 58 in | Wt 157.2 lb

## 2021-11-27 DIAGNOSIS — K219 Gastro-esophageal reflux disease without esophagitis: Secondary | ICD-10-CM

## 2021-11-27 DIAGNOSIS — F430 Acute stress reaction: Secondary | ICD-10-CM

## 2021-11-27 DIAGNOSIS — I25810 Atherosclerosis of coronary artery bypass graft(s) without angina pectoris: Secondary | ICD-10-CM

## 2021-11-27 DIAGNOSIS — J301 Allergic rhinitis due to pollen: Secondary | ICD-10-CM

## 2021-11-27 DIAGNOSIS — F411 Generalized anxiety disorder: Secondary | ICD-10-CM

## 2021-11-27 DIAGNOSIS — I10 Essential (primary) hypertension: Secondary | ICD-10-CM | POA: Diagnosis not present

## 2021-11-27 MED ORDER — ALPRAZOLAM 0.25 MG PO TABS: 0.2500 mg | ORAL_TABLET | Freq: Every day | ORAL | 1 refills | Status: DC

## 2021-11-27 NOTE — Assessment & Plan Note (Signed)

## 2021-11-27 NOTE — Assessment & Plan Note (Signed)
Take Claritin 5 mg p.o. daily 

## 2021-11-27 NOTE — Assessment & Plan Note (Signed)
Denies any chest pain shortness of breath or fluttering of the heart chest is clear heart is regular abdomen is soft

## 2021-11-27 NOTE — Assessment & Plan Note (Signed)
-   Patient experiencing high levels of anxiety.  - Encouraged patient to engage in relaxing activities like yoga, meditation, journaling, going for a walk, or participating in a hobby.  - Encouraged patient to reach out to trusted friends or family members about recent struggles, Patient was advised to read A book, how to stop worrying and start living, it is good book to read to control  the stress  

## 2021-11-27 NOTE — Progress Notes (Signed)
Established Patient Office Visit  Subjective:  Patient ID: Natasha Murray, female    DOB: January 17, 1941  Age: 81 y.o. MRN: [id number]  CC:  Chief Complaint  Patient presents with   Hypertension   Medication Refill    Hypertension  Medication Refill    Aerilyn A Stauder presents for no chest pain or sob BP is ok  Past Medical History:  Diagnosis Date   CAD (coronary artery disease)    GERD (gastroesophageal reflux disease)    HLD (hyperlipidemia)    Hypertension    MI, old 1995   Myocardial infarction ([place]) 10/14/2011    Past Surgical History:  Procedure Laterality Date   ABDOMINAL HYSTERECTOMY     BREAST EXCISIONAL BIOPSY Left    surgical bx neg ? late 1990's   COLONOSCOPY WITH PROPOFOL N/A 03/13/2016   Procedure: COLONOSCOPY WITH PROPOFOL;  Surgeon: Lucilla Lame, MD;  Location: [place];  Service: Endoscopy;  Laterality: N/A;   CORONARY ARTERY BYPASS GRAFT  10/15/2011   5 vessel. [facility]   ESOPHAGOGASTRODUODENOSCOPY (EGD) WITH PROPOFOL N/A 03/13/2016   Procedure: ESOPHAGOGASTRODUODENOSCOPY (EGD) WITH PROPOFOL;  Surgeon: Lucilla Lame, MD;  Location: [place];  Service: Endoscopy;  Laterality: N/A;   POLYPECTOMY N/A 03/13/2016   Procedure: POLYPECTOMY;  Surgeon: Lucilla Lame, MD;  Location: [place];  Service: Endoscopy;  Laterality: N/A;    Family History  Problem Relation Age of Onset   Peripheral vascular disease Father    Prostate cancer Brother    CAD Mother    Hypertension Mother    Breast cancer Sister 45    Social History   Socioeconomic History   Marital status: Widowed    Spouse name: Not on file   Number of children: Not on file   Years of education: Not on file   Highest education level: Not on file  Occupational History   Not on file  Tobacco Use   Smoking status: Never   Smokeless tobacco: Never  Substance and Sexual Activity   Alcohol use: No   Drug use: No   Sexual activity: Not on file  Other Topics Concern    Not on file  Social History Narrative   Not on file   Social Determinants of Health   Financial Resource Strain: Low Risk  (02/21/2021)   Overall Financial Resource Strain (CARDIA)    Difficulty of Paying Living Expenses: Not hard at all  Food Insecurity: No Food Insecurity (02/21/2021)   Hunger Vital Sign    Worried About Running Out of Food in the Last Year: Never true    Ran Out of Food in the Last Year: Never true  Transportation Needs: No Transportation Needs (02/21/2021)   PRAPARE - Hydrologist (Medical): No    Lack of Transportation (Non-Medical): No  Physical Activity: Insufficiently Active (02/21/2021)   Exercise Vital Sign    Days of Exercise per Week: 2 days    Minutes of Exercise per Session: 30 min  Stress: No Stress Concern Present (02/21/2021)   [place]    Feeling of Stress : Not at all  Social Connections: Moderately Integrated (02/21/2021)   Social Connection and Isolation Panel [NHANES]    Frequency of Communication with Friends and Family: More than three times a week    Frequency of Social Gatherings with Friends and Family: More than three times a week    Attends Religious Services: More than 4 times  per year    Active Member of Clubs or Organizations: Yes    Attends Archivist Meetings: More than 4 times per year    Marital Status: Widowed  Intimate Partner Violence: Not At Risk (02/21/2021)   Humiliation, Afraid, Rape, and Kick questionnaire    Fear of Current or Ex-Partner: No    Emotionally Abused: No    Physically Abused: No    Sexually Abused: No     Current Outpatient Medications:    ALPRAZolam (XANAX) 0.25 MG tablet, Take 1 tablet (0.25 mg total) by mouth at bedtime., Disp: 30 tablet, Rfl: 1   amLODipine (NORVASC) 5 MG tablet, Take 1 tablet (5 mg total) by mouth 2 (two) times daily., Disp: 180 tablet, Rfl: 1   aspirin EC 81 MG tablet,  Take 81 mg by mouth daily., Disp: , Rfl:    hydrochlorothiazide (HYDRODIURIL) 25 MG tablet, TAKE 1 TABLET BY MOUTH EVERY DAY, Disp: 90 tablet, Rfl: 3   metoprolol tartrate (LOPRESSOR) 100 MG tablet, TAKE 1 TABLET BY MOUTH TWICE A DAY, Disp: 180 tablet, Rfl: 4   MITIGARE 0.6 MG CAPS, TAKE 1 TABLET BY MOUTH 2 TIMES DAILY. (COVERS MITIGARE), Disp: 60 capsule, Rfl: 6   nitroGLYCERIN (NITROSTAT) 0.4 MG SL tablet, Place 1 tablet (0.4 mg total) under the tongue every 5 (five) minutes as needed for chest pain., Disp: 50 tablet, Rfl: 3   pantoprazole (PROTONIX) 20 MG tablet, TAKE 1 TABLET BY MOUTH EVERY DAY, Disp: 90 tablet, Rfl: 1   rosuvastatin (CRESTOR) 20 MG tablet, Take 1 tablet (20 mg total) by mouth daily., Disp: 90 tablet, Rfl: 3   valsartan (DIOVAN) 80 MG tablet, Take 1 tablet (80 mg total) by mouth 2 (two) times daily., Disp: 180 tablet, Rfl: 2   Allergies  Allergen Reactions   Codeine Nausea And Vomiting    ROS Review of Systems  Constitutional: Negative.   HENT: Negative.    Eyes: Negative.   Respiratory: Negative.    Cardiovascular: Negative.   Gastrointestinal: Negative.   Endocrine: Negative.   Genitourinary: Negative.   Musculoskeletal: Negative.   Skin: Negative.   Allergic/Immunologic: Negative.   Neurological: Negative.   Hematological: Negative.   Psychiatric/Behavioral: Negative.    All other systems reviewed and are negative.     Objective:    Physical Exam Vitals reviewed.  Constitutional:      Appearance: Normal appearance.  HENT:     Mouth/Throat:     Mouth: Mucous membranes are moist.  Eyes:     Pupils: Pupils are equal, round, and reactive to light.  Neck:     Vascular: No carotid bruit.  Cardiovascular:     Rate and Rhythm: Normal rate and regular rhythm.     Pulses: Normal pulses.     Heart sounds: Normal heart sounds.  Pulmonary:     Effort: Pulmonary effort is normal.     Breath sounds: Normal breath sounds.  Abdominal:     General: Bowel  sounds are normal.     Palpations: Abdomen is soft. There is no hepatomegaly, splenomegaly or mass.     Tenderness: There is no abdominal tenderness.     Hernia: No hernia is present.  Musculoskeletal:        General: No tenderness.     Cervical back: Neck supple.     Right lower leg: No edema.     Left lower leg: No edema.  Skin:    Findings: No rash.  Neurological:  Mental Status: She is alert and oriented to person, place, and time.     Motor: No weakness.  Psychiatric:        Mood and Affect: Mood and affect normal.        Behavior: Behavior normal.     BP 130/76   Pulse 64   Ht '4\' 10"'  (1.473 m)   Wt 157 lb 3.2 oz (71.3 kg)   BMI 32.85 kg/m  Wt Readings from Last 3 Encounters:  11/27/21 157 lb 3.2 oz (71.3 kg)  10/30/21 155 lb 4.8 oz (70.4 kg)  08/30/21 161 lb 8 oz (73.3 kg)     Health Maintenance Due  Topic Date Due   Pneumonia Vaccine 76+ Years old (2 - PCV) 02/21/2016   Zoster Vaccines- Shingrix (2 of 2) 10/14/2018   COVID-19 Vaccine (5 - Pfizer series) 09/22/2020   INFLUENZA VACCINE  11/05/2021    There are no preventive care reminders to display for this patient.  Lab Results  Component Value Date   TSH 1.72 08/12/2021   Lab Results  Component Value Date   WBC 3.6 (L) 08/12/2021   HGB 12.6 08/12/2021   HCT 37.5 08/12/2021   MCV 93.8 08/12/2021   PLT 228 08/12/2021   Lab Results  Component Value Date   NA 137 08/12/2021   K 3.5 08/12/2021   CO2 26 08/12/2021   GLUCOSE 90 08/12/2021   BUN 25 08/12/2021   CREATININE 1.60 (H) 08/12/2021   BILITOT 0.4 08/12/2021   ALKPHOS 90 01/28/2016   AST 18 08/12/2021   ALT 14 08/12/2021   PROT 8.0 08/12/2021   ALBUMIN 3.9 01/28/2016   CALCIUM 8.9 08/12/2021   ANIONGAP 10 04/09/2017   EGFR 32 (L) 08/12/2021   Lab Results  Component Value Date   CHOL 182 08/12/2021   Lab Results  Component Value Date   HDL 65 08/12/2021   Lab Results  Component Value Date   LDLCALC 96 08/12/2021   Lab  Results  Component Value Date   TRIG 113 08/12/2021   Lab Results  Component Value Date   CHOLHDL 2.8 08/12/2021   Lab Results  Component Value Date   HGBA1C 5.8 (H) 10/13/2019      Assessment & Plan:   Problem List Items Addressed This Visit       Cardiovascular and Mediastinum   Primary hypertension     Patient denies any chest pain or shortness of breath there is no history of palpitation or paroxysmal nocturnal dyspnea   patient was advised to follow low-salt low-cholesterol diet    ideally I want to keep systolic blood pressure below 130 mmHg, patient was asked to check blood pressure one times a week and give me a report on that.  Patient will be follow-up in 3 months  or earlier as needed, patient will call me back for any change in the cardiovascular symptoms Patient was advised to buy a book from local bookstore concerning blood pressure and read several chapters  every day.  This will be supplemented by some of the material we will give him from the office.  Patient should also utilize other resources like YouTube and Internet to learn more about the blood pressure and the diet.      CAD (coronary artery disease) - Primary    Denies any chest pain shortness of breath or fluttering of the heart chest is clear heart is regular abdomen is soft        Respiratory  Seasonal allergic rhinitis due to pollen    Take Claritin 5 mg p.o. daily        Digestive   Gastroesophageal reflux disease without esophagitis    Counseling  If a person has gastroesophageal reflux disease (GERD), food and stomach acid move back up into the esophagus and cause symptoms or problems such as damage to the esophagus.  Anti-reflux measures include: raising the head of the bed, avoiding tight clothing or belts, avoiding eating late at night, not lying down shortly after mealtime, and achieving weight loss.  Avoid ASA, NSAID's, caffeine, alcohol, and tobacco.   OTC Pepcid and/or Tums are  often very helpful for as needed use.   However, for persisting chronic or daily symptoms, stronger medications like Omeprazole may be needed.  You may need to avoid foods and drinks such as: ? Coffee and tea (with or without caffeine). ? Drinks that contain alcohol. ? Energy drinks and sports drinks. ? Bubbly (carbonated) drinks or sodas. ? Chocolate and cocoa. ? Peppermint and mint flavorings. ? Garlic and onions. ? Horseradish. ? Spicy and acidic foods. These include peppers, chili powder, curry powder, vinegar, hot sauces, and BBQ sauce. ? Citrus fruit juices and citrus fruits, such as oranges, lemons, and limes. ? Tomato-based foods. These include red sauce, chili, salsa, and pizza with red sauce. ? Fried and fatty foods. These include donuts, french fries, potato chips, and high-fat dressings. ? High-fat meats. These include hot dogs, rib eye steak, sausage, ham, and bacon.         Other   Anxiety as acute reaction to exceptional stress    - Patient experiencing high levels of anxiety.  - Encouraged patient to engage in relaxing activities like yoga, meditation, journaling, going for a walk, or participating in a hobby.  - Encouraged patient to reach out to trusted friends or family members about recent struggles, Patient was advised to read A book, how to stop worrying and start living, it is good book to read to control  the stress       Relevant Medications   ALPRAZolam (XANAX) 0.25 MG tablet    Meds ordered this encounter  Medications   ALPRAZolam (XANAX) 0.25 MG tablet    Sig: Take 1 tablet (0.25 mg total) by mouth at bedtime.    Dispense:  30 tablet    Refill:  1    Follow-up: No follow-ups on file.    Cletis Athens, MD

## 2021-11-27 NOTE — Assessment & Plan Note (Signed)

## 2021-12-22 ENCOUNTER — Other Ambulatory Visit: Payer: Self-pay | Admitting: Internal Medicine

## 2021-12-30 ENCOUNTER — Ambulatory Visit: Payer: Medicare PPO | Admitting: Internal Medicine

## 2021-12-30 DIAGNOSIS — K2930 Chronic superficial gastritis without bleeding: Secondary | ICD-10-CM

## 2021-12-30 DIAGNOSIS — I25810 Atherosclerosis of coronary artery bypass graft(s) without angina pectoris: Secondary | ICD-10-CM | POA: Diagnosis not present

## 2021-12-30 DIAGNOSIS — Z23 Encounter for immunization: Secondary | ICD-10-CM | POA: Diagnosis not present

## 2021-12-30 DIAGNOSIS — I10 Essential (primary) hypertension: Secondary | ICD-10-CM

## 2021-12-30 DIAGNOSIS — F411 Generalized anxiety disorder: Secondary | ICD-10-CM | POA: Diagnosis not present

## 2021-12-30 DIAGNOSIS — F430 Acute stress reaction: Secondary | ICD-10-CM

## 2021-12-30 MED ORDER — ALPRAZOLAM 0.25 MG PO TABS: 0.2500 mg | ORAL_TABLET | Freq: Every day | ORAL | 1 refills | Status: AC

## 2021-12-30 NOTE — Assessment & Plan Note (Signed)
Flu shot was given, she needed COVID shot from the pharmacy and the shingles shot.

## 2021-12-30 NOTE — Assessment & Plan Note (Signed)
Denies any pain or bleeding

## 2021-12-30 NOTE — Progress Notes (Signed)
Established Patient Office Visit  Subjective:  Patient ID: Natasha Murray, female    DOB: September 14, 1940  Age: 81 y.o. MRN: [id number]  CC:  Chief Complaint  Patient presents with   Anxiety    Patient here for refill of xanax     Anxiety      Natasha Murray presents for check up  Past Medical History:  Diagnosis Date   CAD (coronary artery disease)    GERD (gastroesophageal reflux disease)    HLD (hyperlipidemia)    Hypertension    MI, old 1995   Myocardial infarction ([place]) 10/14/2011    Past Surgical History:  Procedure Laterality Date   ABDOMINAL HYSTERECTOMY     BREAST EXCISIONAL BIOPSY Left    surgical bx neg ? late 1990's   COLONOSCOPY WITH PROPOFOL N/A 03/13/2016   Procedure: COLONOSCOPY WITH PROPOFOL;  Surgeon: Natasha Lame, MD;  Location: [facility];  Service: Endoscopy;  Laterality: N/A;   CORONARY ARTERY BYPASS GRAFT  10/15/2011   5 vessel. [facility]   ESOPHAGOGASTRODUODENOSCOPY (EGD) WITH PROPOFOL N/A 03/13/2016   Procedure: ESOPHAGOGASTRODUODENOSCOPY (EGD) WITH PROPOFOL;  Surgeon: Natasha Lame, MD;  Location: [facility];  Service: Endoscopy;  Laterality: N/A;   POLYPECTOMY N/A 03/13/2016   Procedure: POLYPECTOMY;  Surgeon: Natasha Lame, MD;  Location: [facility];  Service: Endoscopy;  Laterality: N/A;    Family History  Problem Relation Age of Onset   Peripheral vascular disease Father    Prostate cancer Brother    CAD Mother    Hypertension Mother    Breast cancer Sister 63    Social History   Socioeconomic History   Marital status: Widowed    Spouse name: Not on file   Number of children: Not on file   Years of education: Not on file   Highest education level: Not on file  Occupational History   Not on file  Tobacco Use   Smoking status: Never   Smokeless tobacco: Never  Substance and Sexual Activity   Alcohol use: No   Drug use: No   Sexual activity: Not on file  Other Topics Concern   Not on file  Social History  Narrative   Not on file   Social Determinants of Health   Financial Resource Strain: Low Risk  (02/21/2021)   Overall Financial Resource Strain (CARDIA)    Difficulty of Paying Living Expenses: Not hard at all  Food Insecurity: No Food Insecurity (02/21/2021)   Hunger Vital Sign    Worried About Running Out of Food in the Last Year: Never true    Ran Out of Food in the Last Year: Never true  Transportation Needs: No Transportation Needs (02/21/2021)   PRAPARE - Hydrologist (Medical): No    Lack of Transportation (Non-Medical): No  Physical Activity: Insufficiently Active (02/21/2021)   Exercise Vital Sign    Days of Exercise per Week: 2 days    Minutes of Exercise per Session: 30 min  Stress: No Stress Concern Present (02/21/2021)   [place]    Feeling of Stress : Not at all  Social Connections: Moderately Integrated (02/21/2021)   Social Connection and Isolation Panel [NHANES]    Frequency of Communication with Friends and Family: More than three times a week    Frequency of Social Gatherings with Friends and Family: More than three times a week    Attends Religious Services: More than 4 times per  year    Active Member of Clubs or Organizations: Yes    Attends Archivist Meetings: More than 4 times per year    Marital Status: Widowed  Intimate Partner Violence: Not At Risk (02/21/2021)   Humiliation, Afraid, Rape, and Kick questionnaire    Fear of Current or Ex-Partner: No    Emotionally Abused: No    Physically Abused: No    Sexually Abused: No     Current Outpatient Medications:    ALPRAZolam (XANAX) 0.25 MG tablet, Take 1 tablet (0.25 mg total) by mouth at bedtime., Disp: 30 tablet, Rfl: 1   amLODipine (NORVASC) 5 MG tablet, TAKE 1 TABLET BY MOUTH TWICE A DAY, Disp: 180 tablet, Rfl: 1   aspirin EC 81 MG tablet, Take 81 mg by mouth daily., Disp: , Rfl:     hydrochlorothiazide (HYDRODIURIL) 25 MG tablet, TAKE 1 TABLET BY MOUTH EVERY DAY, Disp: 90 tablet, Rfl: 3   metoprolol tartrate (LOPRESSOR) 100 MG tablet, TAKE 1 TABLET BY MOUTH TWICE A DAY, Disp: 180 tablet, Rfl: 4   MITIGARE 0.6 MG CAPS, TAKE 1 TABLET BY MOUTH 2 TIMES DAILY. (COVERS MITIGARE), Disp: 60 capsule, Rfl: 6   nitroGLYCERIN (NITROSTAT) 0.4 MG SL tablet, Place 1 tablet (0.4 mg total) under the tongue every 5 (five) minutes as needed for chest pain., Disp: 50 tablet, Rfl: 3   pantoprazole (PROTONIX) 20 MG tablet, TAKE 1 TABLET BY MOUTH EVERY DAY, Disp: 90 tablet, Rfl: 1   rosuvastatin (CRESTOR) 20 MG tablet, Take 1 tablet (20 mg total) by mouth daily., Disp: 90 tablet, Rfl: 3   valsartan (DIOVAN) 80 MG tablet, Take 1 tablet (80 mg total) by mouth 2 (two) times daily., Disp: 180 tablet, Rfl: 2   Allergies  Allergen Reactions   Codeine Nausea And Vomiting    ROS Review of Systems  Constitutional: Negative.   HENT: Negative.    Eyes: Negative.   Respiratory: Negative.    Cardiovascular: Negative.   Gastrointestinal: Negative.   Endocrine: Negative.   Genitourinary: Negative.   Musculoskeletal: Negative.   Skin: Negative.   Allergic/Immunologic: Negative.   Neurological: Negative.   Hematological: Negative.   Psychiatric/Behavioral: Negative.    All other systems reviewed and are negative.     Objective:    Physical Exam Vitals reviewed.  Constitutional:      Appearance: Normal appearance.  HENT:     Mouth/Throat:     Mouth: Mucous membranes are moist.  Eyes:     Pupils: Pupils are equal, round, and reactive to light.  Neck:     Vascular: No carotid bruit.  Cardiovascular:     Rate and Rhythm: Normal rate and regular rhythm.     Pulses: Normal pulses.     Heart sounds: Normal heart sounds.  Pulmonary:     Effort: Pulmonary effort is normal.     Breath sounds: Normal breath sounds.  Abdominal:     General: Bowel sounds are normal.     Palpations: Abdomen is  soft. There is no hepatomegaly, splenomegaly or mass.     Tenderness: There is no abdominal tenderness.     Hernia: No hernia is present.  Musculoskeletal:        General: No tenderness.     Cervical back: Neck supple.     Right lower leg: No edema.     Left lower leg: No edema.  Skin:    Findings: No rash.  Neurological:     Mental Status: She is alert  and oriented to person, place, and time.     Motor: No weakness.  Psychiatric:        Mood and Affect: Mood and affect normal.        Behavior: Behavior normal.     There were no vitals taken for this visit. Wt Readings from Last 3 Encounters:  11/27/21 157 lb 3.2 oz (71.3 kg)  10/30/21 155 lb 4.8 oz (70.4 kg)  08/30/21 161 lb 8 oz (73.3 kg)     Health Maintenance Due  Topic Date Due   Pneumonia Vaccine 51+ Years old (2 - PCV) 02/21/2016   Zoster Vaccines- Shingrix (2 of 2) 10/14/2018   COVID-19 Vaccine (5 - Pfizer series) 09/22/2020    There are no preventive care reminders to display for this patient.  Lab Results  Component Value Date   TSH 1.72 08/12/2021   Lab Results  Component Value Date   WBC 3.6 (L) 08/12/2021   HGB 12.6 08/12/2021   HCT 37.5 08/12/2021   MCV 93.8 08/12/2021   PLT 228 08/12/2021   Lab Results  Component Value Date   NA 137 08/12/2021   K 3.5 08/12/2021   CO2 26 08/12/2021   GLUCOSE 90 08/12/2021   BUN 25 08/12/2021   CREATININE 1.60 (H) 08/12/2021   BILITOT 0.4 08/12/2021   ALKPHOS 90 01/28/2016   AST 18 08/12/2021   ALT 14 08/12/2021   PROT 8.0 08/12/2021   ALBUMIN 3.9 01/28/2016   CALCIUM 8.9 08/12/2021   ANIONGAP 10 04/09/2017   EGFR 32 (L) 08/12/2021   Lab Results  Component Value Date   CHOL 182 08/12/2021   Lab Results  Component Value Date   HDL 65 08/12/2021   Lab Results  Component Value Date   LDLCALC 96 08/12/2021   Lab Results  Component Value Date   TRIG 113 08/12/2021   Lab Results  Component Value Date   CHOLHDL 2.8 08/12/2021   Lab Results   Component Value Date   HGBA1C 5.8 (H) 10/13/2019      Assessment & Plan:   Problem List Items Addressed This Visit       Cardiovascular and Mediastinum   Primary hypertension   CAD (coronary artery disease)     Digestive   Gastritis without bleeding    Denies any pain or bleeding        Other   Anxiety as acute reaction to exceptional stress   Relevant Medications   ALPRAZolam (XANAX) 0.25 MG tablet   Need for influenza vaccination - Primary    Flu shot was given, she needed COVID shot from the pharmacy and the shingles shot.      Relevant Orders   Flu Vaccine QUAD High Dose(Fluad) (Completed)    Meds ordered this encounter  Medications   ALPRAZolam (XANAX) 0.25 MG tablet    Sig: Take 1 tablet (0.25 mg total) by mouth at bedtime.    Dispense:  30 tablet    Refill:  1    Follow-up: No follow-ups on file.    Cletis Athens, MD

## 2022-01-06 ENCOUNTER — Other Ambulatory Visit: Payer: Self-pay | Admitting: Internal Medicine

## 2022-01-06 DIAGNOSIS — K5909 Other constipation: Secondary | ICD-10-CM

## 2022-01-06 DIAGNOSIS — K5900 Constipation, unspecified: Secondary | ICD-10-CM

## 2022-02-24 ENCOUNTER — Ambulatory Visit (INDEPENDENT_AMBULATORY_CARE_PROVIDER_SITE_OTHER): Payer: Medicare PPO

## 2022-02-24 VITALS — Ht <= 58 in | Wt 157.0 lb

## 2022-02-24 DIAGNOSIS — Z0000 Encounter for general adult medical examination without abnormal findings: Secondary | ICD-10-CM | POA: Diagnosis not present

## 2022-02-24 NOTE — Progress Notes (Signed)
Virtual Visit via Telephone Note  I connected with  Natasha Murray on 02/24/22 at  9:00 AM EST by telephone and verified that I am speaking with the correct person using two identifiers.  Location: Patient: home Provider: [place] Persons participating in the virtual visit: patient/Nurse Health Advisor   I discussed the limitations, risks, security and privacy concerns of performing an evaluation and management service by telephone and the availability of in person appointments. The patient expressed understanding and agreed to proceed.  Interactive audio and video telecommunications were attempted between this nurse and patient, however failed, due to patient having technical difficulties OR patient did not have access to video capability.  We continued and completed visit with audio only.  Some vital signs may be absent or patient reported.   Dionisio David, LPN  Subjective:   Natasha Murray is a 81 y.o. female who presents for Medicare Annual (Subsequent) preventive examination.  Review of Systems     Cardiac Risk Factors include: advanced age (>72mn, >>72women);dyslipidemia;hypertension     Objective:    There were no vitals filed for this visit. There is no height or weight on file to calculate BMI.     02/24/2022    9:12 AM 02/21/2021    3:26 PM 03/13/2016    7:41 AM 01/28/2016    2:29 AM 01/06/2016    3:52 PM 12/17/2015    9:00 AM 12/16/2015   11:00 PM  Advanced Directives  Does Patient Have a Medical Advance Directive? No No No No No No No  Would patient like information on creating a medical advance directive? No - Patient declined No - Patient declined No - Patient declined    Yes - Educational materials given    Current Medications (verified) Outpatient Encounter Medications as of 02/24/2022  Medication Sig   allopurinol (ZYLOPRIM) 100 MG tablet Take 100 mg by mouth daily.   ALPRAZolam (XANAX) 0.25 MG tablet Take 1 tablet (0.25 mg total) by mouth at bedtime.    amLODipine (NORVASC) 5 MG tablet TAKE 1 TABLET BY MOUTH TWICE A DAY   aspirin EC 81 MG tablet Take 81 mg by mouth daily.   hydrochlorothiazide (HYDRODIURIL) 25 MG tablet TAKE 1 TABLET BY MOUTH EVERY DAY   lisinopril (ZESTRIL) 5 MG tablet Take 5 mg by mouth daily.   metoprolol tartrate (LOPRESSOR) 100 MG tablet TAKE 1 TABLET BY MOUTH TWICE A DAY   MITIGARE 0.6 MG CAPS TAKE 1 TABLET BY MOUTH 2 TIMES DAILY. (COVERS MITIGARE)   nitroGLYCERIN (NITROSTAT) 0.4 MG SL tablet Place 1 tablet (0.4 mg total) under the tongue every 5 (five) minutes as needed for chest pain.   pantoprazole (PROTONIX) 20 MG tablet TAKE 1 TABLET BY MOUTH EVERY DAY   pravastatin (PRAVACHOL) 20 MG tablet Take by mouth.   rosuvastatin (CRESTOR) 20 MG tablet Take 1 tablet (20 mg total) by mouth daily.   valsartan (DIOVAN) 160 MG tablet Take 160 mg by mouth 2 (two) times daily.   valsartan (DIOVAN) 80 MG tablet Take 1 tablet (80 mg total) by mouth 2 (two) times daily.   traMADol (ULTRAM) 50 MG tablet SMARTSIG:0.5 Tablet(s) By Mouth Every 12 Hours PRN (Patient not taking: Reported on 02/24/2022)   No facility-administered encounter medications on file as of 02/24/2022.    Allergies (verified) Codeine   History: Past Medical History:  Diagnosis Date   CAD (coronary artery disease)    GERD (gastroesophageal reflux disease)    HLD (hyperlipidemia)  Hypertension    MI, old 1995   Myocardial infarction ([place]) 10/14/2011   Past Surgical History:  Procedure Laterality Date   ABDOMINAL HYSTERECTOMY     BREAST EXCISIONAL BIOPSY Left    surgical bx neg ? late 1990's   COLONOSCOPY WITH PROPOFOL N/A 03/13/2016   Procedure: COLONOSCOPY WITH PROPOFOL;  Surgeon: Lucilla Lame, MD;  Location: [facility];  Service: Endoscopy;  Laterality: N/A;   CORONARY ARTERY BYPASS GRAFT  10/15/2011   5 vessel. [facility]   ESOPHAGOGASTRODUODENOSCOPY (EGD) WITH PROPOFOL N/A 03/13/2016   Procedure: ESOPHAGOGASTRODUODENOSCOPY (EGD) WITH PROPOFOL;   Surgeon: Lucilla Lame, MD;  Location: [facility];  Service: Endoscopy;  Laterality: N/A;   POLYPECTOMY N/A 03/13/2016   Procedure: POLYPECTOMY;  Surgeon: Lucilla Lame, MD;  Location: [facility];  Service: Endoscopy;  Laterality: N/A;   Family History  Problem Relation Age of Onset   Peripheral vascular disease Father    Prostate cancer Brother    CAD Mother    Hypertension Mother    Breast cancer Sister 42   Social History   Socioeconomic History   Marital status: Widowed    Spouse name: Not on file   Number of children: Not on file   Years of education: Not on file   Highest education level: Not on file  Occupational History   Not on file  Tobacco Use   Smoking status: Never   Smokeless tobacco: Never  Substance and Sexual Activity   Alcohol use: No   Drug use: No   Sexual activity: Not on file  Other Topics Concern   Not on file  Social History Narrative   Not on file   Social Determinants of Health   Financial Resource Strain: Low Risk  (02/24/2022)   Overall Financial Resource Strain (CARDIA)    Difficulty of Paying Living Expenses: Not hard at all  Food Insecurity: No Food Insecurity (02/24/2022)   Hunger Vital Sign    Worried About Running Out of Food in the Last Year: Never true    Ran Out of Food in the Last Year: Never true  Transportation Needs: No Transportation Needs (02/24/2022)   PRAPARE - Hydrologist (Medical): No    Lack of Transportation (Non-Medical): No  Physical Activity: Insufficiently Active (02/24/2022)   Exercise Vital Sign    Days of Exercise per Week: 2 days    Minutes of Exercise per Session: 30 min  Stress: No Stress Concern Present (02/24/2022)   [place]    Feeling of Stress : Not at all  Social Connections: Moderately Isolated (02/24/2022)   Social Connection and Isolation Panel [NHANES]    Frequency of Communication  with Friends and Family: More than three times a week    Frequency of Social Gatherings with Friends and Family: Once a week    Attends Religious Services: More than 4 times per year    Active Member of [employer] or Organizations: No    Attends Archivist Meetings: Never    Marital Status: Widowed    Tobacco Counseling Counseling given: Not Answered   Clinical Intake:  Pre-visit preparation completed: Yes  Pain : No/denies pain     Nutritional Risks: None Diabetes: No  How often do you need to have someone help you when you read instructions, pamphlets, or other written materials from your doctor or pharmacy?: 1 - Never  Diabetic?no  Interpreter Needed?: No  Information entered by ::  Kirke Shaggy, LPN   Activities of Daily Living    02/24/2022    9:13 AM  In your present state of health, do you have any difficulty performing the following activities:  Hearing? 0  Vision? 0  Difficulty concentrating or making decisions? 0  Walking or climbing stairs? 0  Dressing or bathing? 0  Doing errands, shopping? 0  Preparing Food and eating ? N  Using the Toilet? N  In the past six months, have you accidently leaked urine? N  Do you have problems with loss of bowel control? N  Managing your Medications? N  Managing your Finances? N  Housekeeping or managing your Housekeeping? N    Patient Care Team: Cletis Athens, MD as PCP - General (Internal Medicine)  Indicate any recent Medical Services you may have received from other than [facility] providers in the past year (date may be approximate).     Assessment:   This is a routine wellness examination for Natasha Murray.  Hearing/Vision screen Hearing Screening - Comments:: Wears aids Vision Screening - Comments:: No glasses- [facility]  Dietary issues and exercise activities discussed: Current Exercise Habits: Home exercise routine, Time (Minutes): 30, Frequency (Times/Week): 2, Weekly Exercise (Minutes/Week): 60,  Intensity: Mild   Goals Addressed             This Visit's Progress    DIET - EAT MORE FRUITS AND VEGETABLES         Depression Screen    02/24/2022    9:11 AM 12/30/2021   10:14 AM 11/27/2021   11:00 AM 04/23/2021   10:16 AM 02/21/2021    3:30 PM 12/08/2019    9:15 AM  PHQ 2/9 Scores  PHQ - 2 Score 0 0 0 0 0 0  PHQ- 9 Score 0         Fall Risk    02/24/2022    9:13 AM 12/30/2021   10:14 AM 11/27/2021   11:00 AM 04/23/2021   10:15 AM 12/08/2019    9:15 AM  Fall Risk   Falls in the past year? 0 0 0 0 0  Number falls in past yr: 0 0 0 0 0  Injury with Fall? 0 0 0 0 0  Risk for fall due to : No Fall Risks No Fall Risks  No Fall Risks   Follow up Falls prevention discussed;Falls evaluation completed Falls evaluation completed  Falls evaluation completed     FALL RISK PREVENTION PERTAINING TO THE HOME:  Any stairs in or around the home? No  If so, are there any without handrails? No  Home free of loose throw rugs in walkways, pet beds, electrical cords, etc? Yes  Adequate lighting in your home to reduce risk of falls? Yes   ASSISTIVE DEVICES UTILIZED TO PREVENT FALLS:  Life alert? No  Use of a cane, walker or w/c? No  Grab bars in the bathroom? Yes  Shower chair or bench in shower? No  Elevated toilet seat or a handicapped toilet? No    Cognitive Function:    02/21/2021    3:33 PM  MMSE - Mini Mental State Exam  Not completed: Unable to complete        02/24/2022    9:13 AM 02/21/2021    3:33 PM  6CIT Screen  What Year? 0 points 0 points  What month? 0 points 0 points  What time? 0 points 0 points  Count back from 20 0 points 0 points  Months in reverse 0 points  0 points  Repeat phrase 0 points 0 points  Total Score 0 points 0 points    Immunizations Immunization History  Administered Date(s) Administered   Fluad Quad(high Dose 65+) 12/08/2019, 12/19/2020, 12/30/2021   PFIZER(Purple Top)SARS-COV-2 Vaccination 05/09/2019, 05/30/2019, 02/01/2020,  07/28/2020   Pneumococcal Polysaccharide-23 02/21/2015   Tdap 12/19/2020   Zoster Recombinat (Shingrix) 08/19/2018    TDAP status: Up to date  Flu Vaccine status: Up to date  Pneumococcal vaccine status: Up to date  Covid-19 vaccine status: Completed vaccines  Qualifies for Shingles Vaccine? Yes   Zostavax completed No   Shingrix Completed?: No.    Education has been provided regarding the importance of this vaccine. Patient has been advised to call insurance company to determine out of pocket expense if they have not yet received this vaccine. Advised may also receive vaccine at local pharmacy or Health Dept. Verbalized acceptance and understanding.  Screening Tests Health Maintenance  Topic Date Due   DEXA SCAN  Never done   Pneumonia Vaccine 67+ Years old (2 - PCV) 02/21/2016   Zoster Vaccines- Shingrix (2 of 2) 10/14/2018   COVID-19 Vaccine (5 - Pfizer series) 09/22/2020   Medicare Annual Wellness (AWV)  02/25/2023   INFLUENZA VACCINE  Completed   HPV VACCINES  Aged Out    Health Maintenance  Health Maintenance Due  Topic Date Due   DEXA SCAN  Never done   Pneumonia Vaccine 54+ Years old (2 - PCV) 02/21/2016   Zoster Vaccines- Shingrix (2 of 2) 10/14/2018   COVID-19 Vaccine (5 - Pfizer series) 09/22/2020    Colorectal cancer screening: Type of screening: Colonoscopy. Completed 03/13/16. Repeat every 10 years- aged out  Mammogram status: Completed 10/24/21. Repeat every year  BDS referral declined  Lung Cancer Screening: (Low Dose CT Chest recommended if Age 23-80 years, 30 pack-year currently smoking OR have quit w/in 15years.) does not qualify.   Additional Screening:  Hepatitis C Screening: does not qualify; Completed no  Vision Screening: Recommended annual ophthalmology exams for early detection of glaucoma and other disorders of the eye. Is the patient up to date with their annual eye exam?  Yes  Who is the provider or what is the name of the office in  which the patient attends annual eye exams? [place] If pt is not established with a provider, would they like to be referred to a provider to establish care? No .   Dental Screening: Recommended annual dental exams for proper oral hygiene  Community Resource Referral / Chronic Care Management: CRR required this visit?  No   CCM required this visit?  No      Plan:     I have personally reviewed and noted the following in the patient's chart:   Medical and social history Use of alcohol, tobacco or illicit drugs  Current medications and supplements including opioid prescriptions. Patient is not currently taking opioid prescriptions. Functional ability and status Nutritional status Physical activity Advanced directives List of other physicians Hospitalizations, surgeries, and ER visits in previous 12 months Vitals Screenings to include cognitive, depression, and falls Referrals and appointments  In addition, I have reviewed and discussed with patient certain preventive protocols, quality metrics, and best practice recommendations. A written personalized care plan for preventive services as well as general preventive health recommendations were provided to patient.     Dionisio David, LPN   [id number]   Nurse Notes: none

## 2022-02-24 NOTE — Patient Instructions (Signed)
Natasha Murray , Thank you for taking time to come for your Medicare Wellness Visit. I appreciate your ongoing commitment to your health goals. Please review the following plan we discussed and let me know if I can assist you in the future.   Screening recommendations/referrals: Colonoscopy: aged out Mammogram: 10/24/21 Bone Density: declined Recommended yearly ophthalmology/optometry visit for glaucoma screening and checkup Recommended yearly dental visit for hygiene and checkup  Vaccinations: Influenza vaccine: 12/30/21  Pneumococcal vaccine: 02/21/15 Tdap vaccine: 12/19/20 Shingles vaccine: Shingrix 08/19/18, need 2nd shot   Covid-19:05/09/19, 05/30/19, 05/04/19, 07/28/20  Advanced directives: no  Conditions/risks identified: none  Next appointment: Follow up in one year for your annual wellness visit 02/26/23 @ 9 am by phone   Preventive Care 65 Years and Older, Female Preventive care refers to lifestyle choices and visits with your health care provider that can promote health and wellness. What does preventive care include? A yearly physical exam. This is also called an annual well check. Dental exams once or twice a year. Routine eye exams. Ask your health care provider how often you should have your eyes checked. Personal lifestyle choices, including: Daily care of your teeth and gums. Regular physical activity. Eating a healthy diet. Avoiding tobacco and drug use. Limiting alcohol use. Practicing safe sex. Taking low-dose aspirin every day. Taking vitamin and mineral supplements as recommended by your health care provider. What happens during an annual well check? The services and screenings done by your health care provider during your annual well check will depend on your age, overall health, lifestyle risk factors, and family history of disease. Counseling  Your health care provider may ask you questions about your: Alcohol use. Tobacco use. Drug use. Emotional  well-being. Home and relationship well-being. Sexual activity. Eating habits. History of falls. Memory and ability to understand (cognition). Work and work Statistician. Reproductive health. Screening  You may have the following tests or measurements: Height, weight, and BMI. Blood pressure. Lipid and cholesterol levels. These may be checked every 5 years, or more frequently if you are over 38 years old. Skin check. Lung cancer screening. You may have this screening every year starting at age 47 if you have a 30-pack-year history of smoking and currently smoke or have quit within the past 15 years. Fecal occult blood test (FOBT) of the stool. You may have this test every year starting at age 64. Flexible sigmoidoscopy or colonoscopy. You may have a sigmoidoscopy every 5 years or a colonoscopy every 10 years starting at age 11. Hepatitis C blood test. Hepatitis B blood test. Sexually transmitted disease (STD) testing. Diabetes screening. This is done by checking your blood sugar (glucose) after you have not eaten for a while (fasting). You may have this done every 1-3 years. Bone density scan. This is done to screen for osteoporosis. You may have this done starting at age 67. Mammogram. This may be done every 1-2 years. Talk to your health care provider about how often you should have regular mammograms. Talk with your health care provider about your test results, treatment options, and if necessary, the need for more tests. Vaccines  Your health care provider may recommend certain vaccines, such as: Influenza vaccine. This is recommended every year. Tetanus, diphtheria, and acellular pertussis (Tdap, Td) vaccine. You may need a Td booster every 10 years. Zoster vaccine. You may need this after age 69. Pneumococcal 13-valent conjugate (PCV13) vaccine. One dose is recommended after age 17. Pneumococcal polysaccharide (PPSV23) vaccine. One dose is recommended after  age 2. Talk to your  health care provider about which screenings and vaccines you need and how often you need them. This information is not intended to replace advice given to you by your health care provider. Make sure you discuss any questions you have with your health care provider. Document Released: 04/20/2015 Document Revised: 12/12/2015 Document Reviewed: 01/23/2015 Elsevier Interactive Patient Education  2017 [place] Prevention in the Home Falls can cause injuries. They can happen to people of all ages. There are many things you can do to make your home safe and to help prevent falls. What can I do on the outside of my home? Regularly fix the edges of walkways and driveways and fix any cracks. Remove anything that might make you trip as you walk through a door, such as a raised step or threshold. Trim any bushes or trees on the path to your home. Use bright outdoor lighting. Clear any walking paths of anything that might make someone trip, such as rocks or tools. Regularly check to see if handrails are loose or broken. Make sure that both sides of any steps have handrails. Any raised decks and porches should have guardrails on the edges. Have any leaves, snow, or ice cleared regularly. Use sand or salt on walking paths during winter. Clean up any spills in your garage right away. This includes oil or grease spills. What can I do in the bathroom? Use night lights. Install grab bars by the toilet and in the tub and shower. Do not use towel bars as grab bars. Use non-skid mats or decals in the tub or shower. If you need to sit down in the shower, use a plastic, non-slip stool. Keep the floor dry. Clean up any water that spills on the floor as soon as it happens. Remove soap buildup in the tub or shower regularly. Attach bath mats securely with double-sided non-slip rug tape. Do not have throw rugs and other things on the floor that can make you trip. What can I do in the bedroom? Use night  lights. Make sure that you have a light by your bed that is easy to reach. Do not use any sheets or blankets that are too big for your bed. They should not hang down onto the floor. Have a firm chair that has side arms. You can use this for support while you get dressed. Do not have throw rugs and other things on the floor that can make you trip. What can I do in the kitchen? Clean up any spills right away. Avoid walking on wet floors. Keep items that you use a lot in easy-to-reach places. If you need to reach something above you, use a strong step stool that has a grab bar. Keep electrical cords out of the way. Do not use floor polish or wax that makes floors slippery. If you must use wax, use non-skid floor wax. Do not have throw rugs and other things on the floor that can make you trip. What can I do with my stairs? Do not leave any items on the stairs. Make sure that there are handrails on both sides of the stairs and use them. Fix handrails that are broken or loose. Make sure that handrails are as long as the stairways. Check any carpeting to make sure that it is firmly attached to the stairs. Fix any carpet that is loose or worn. Avoid having throw rugs at the top or bottom of the stairs. If you do  have throw rugs, attach them to the floor with carpet tape. Make sure that you have a light switch at the top of the stairs and the bottom of the stairs. If you do not have them, ask someone to add them for you. What else can I do to help prevent falls? Wear shoes that: Do not have high heels. Have rubber bottoms. Are comfortable and fit you well. Are closed at the toe. Do not wear sandals. If you use a stepladder: Make sure that it is fully opened. Do not climb a closed stepladder. Make sure that both sides of the stepladder are locked into place. Ask someone to hold it for you, if possible. Clearly mark and make sure that you can see: Any grab bars or handrails. First and last  steps. Where the edge of each step is. Use tools that help you move around (mobility aids) if they are needed. These include: Canes. Walkers. Scooters. Crutches. Turn on the lights when you go into a dark area. Replace any light bulbs as soon as they burn out. Set up your furniture so you have a clear path. Avoid moving your furniture around. If any of your floors are uneven, fix them. If there are any pets around you, be aware of where they are. Review your medicines with your doctor. Some medicines can make you feel dizzy. This can increase your chance of falling. Ask your doctor what other things that you can do to help prevent falls. This information is not intended to replace advice given to you by your health care provider. Make sure you discuss any questions you have with your health care provider. Document Released: 01/18/2009 Document Revised: 08/30/2015 Document Reviewed: 04/28/2014 Elsevier Interactive Patient Education  2017 [employer].

## 2022-02-24 NOTE — Progress Notes (Signed)
I have reviewed this visit and agree with the documentation.   

## 2022-06-05 ENCOUNTER — Other Ambulatory Visit: Payer: Self-pay | Admitting: Internal Medicine

## 2022-06-05 DIAGNOSIS — D890 Polyclonal hypergammaglobulinemia: Secondary | ICD-10-CM

## 2022-06-09 ENCOUNTER — Ambulatory Visit
Admission: RE | Admit: 2022-06-09 | Discharge: 2022-06-09 | Disposition: A | Discharge status: Home / Self Care | Payer: Medicare PPO | Source: Ambulatory Visit | Attending: Internal Medicine | Admitting: Internal Medicine

## 2022-06-09 ENCOUNTER — Ambulatory Visit
Admission: RE | Admit: 2022-06-09 | Discharge: 2022-06-09 | Disposition: A | Discharge status: Home / Self Care | Payer: Medicare PPO | Attending: Internal Medicine | Admitting: Internal Medicine

## 2022-06-09 DIAGNOSIS — D890 Polyclonal hypergammaglobulinemia: Secondary | ICD-10-CM | POA: Insufficient documentation

## 2022-09-17 ENCOUNTER — Other Ambulatory Visit: Payer: Self-pay | Admitting: Internal Medicine

## 2022-09-17 DIAGNOSIS — Z1231 Encounter for screening mammogram for malignant neoplasm of breast: Secondary | ICD-10-CM

## 2022-10-28 ENCOUNTER — Ambulatory Visit
Admission: RE | Admit: 2022-10-28 | Discharge: 2022-10-28 | Disposition: A | Discharge status: Home / Self Care | Payer: Medicare PPO | Source: Ambulatory Visit | Attending: Internal Medicine | Admitting: Internal Medicine

## 2022-10-28 DIAGNOSIS — Z1231 Encounter for screening mammogram for malignant neoplasm of breast: Secondary | ICD-10-CM | POA: Diagnosis not present

## 2022-11-17 ENCOUNTER — Inpatient Hospital Stay: Payer: Medicare PPO | Attending: Internal Medicine | Admitting: Internal Medicine

## 2022-11-17 ENCOUNTER — Inpatient Hospital Stay: Payer: Medicare PPO

## 2022-11-17 ENCOUNTER — Encounter: Payer: Self-pay | Admitting: Internal Medicine

## 2022-11-17 VITALS — BP 153/76 | HR 61 | Temp 96.6°F | Resp 18 | Ht <= 58 in | Wt 159.7 lb

## 2022-11-17 DIAGNOSIS — Z79899 Other long term (current) drug therapy: Secondary | ICD-10-CM | POA: Insufficient documentation

## 2022-11-17 DIAGNOSIS — E785 Hyperlipidemia, unspecified: Secondary | ICD-10-CM | POA: Insufficient documentation

## 2022-11-17 DIAGNOSIS — I129 Hypertensive chronic kidney disease with stage 1 through stage 4 chronic kidney disease, or unspecified chronic kidney disease: Secondary | ICD-10-CM | POA: Insufficient documentation

## 2022-11-17 DIAGNOSIS — Z803 Family history of malignant neoplasm of breast: Secondary | ICD-10-CM | POA: Insufficient documentation

## 2022-11-17 DIAGNOSIS — D472 Monoclonal gammopathy: Secondary | ICD-10-CM

## 2022-11-17 DIAGNOSIS — R778 Other specified abnormalities of plasma proteins: Secondary | ICD-10-CM

## 2022-11-17 DIAGNOSIS — M8580 Other specified disorders of bone density and structure, unspecified site: Secondary | ICD-10-CM | POA: Insufficient documentation

## 2022-11-17 DIAGNOSIS — R809 Proteinuria, unspecified: Secondary | ICD-10-CM | POA: Insufficient documentation

## 2022-11-17 DIAGNOSIS — N183 Chronic kidney disease, stage 3 unspecified: Secondary | ICD-10-CM | POA: Diagnosis not present

## 2022-11-17 DIAGNOSIS — R768 Other specified abnormal immunological findings in serum: Secondary | ICD-10-CM | POA: Diagnosis present

## 2022-11-17 DIAGNOSIS — N1832 Chronic kidney disease, stage 3b: Secondary | ICD-10-CM

## 2022-11-17 LAB — COMPREHENSIVE METABOLIC PANEL
ALT: 40 U/L (ref 0–44)
AST: 44 U/L — ABNORMAL HIGH (ref 15–41)
Albumin: 4.2 g/dL (ref 3.5–5.0)
Alkaline Phosphatase: 105 U/L (ref 38–126)
Anion gap: 11 (ref 5–15)
BUN: 23 mg/dL (ref 8–23)
CO2: 25 mmol/L (ref 22–32)
Calcium: 9.2 mg/dL (ref 8.9–10.3)
Chloride: 96 mmol/L — ABNORMAL LOW (ref 98–111)
Creatinine, Ser: 1.37 mg/dL — ABNORMAL HIGH (ref 0.44–1.00)
GFR, Estimated: 39 mL/min — ABNORMAL LOW (ref >60)
Glucose, Bld: 102 mg/dL — ABNORMAL HIGH (ref 70–99)
Potassium: 3.7 mmol/L (ref 3.5–5.1)
Sodium: 132 mmol/L — ABNORMAL LOW (ref 135–145)
Total Bilirubin: 0.8 mg/dL (ref 0.3–1.2)
Total Protein: 10 g/dL — ABNORMAL HIGH (ref 6.5–8.1)

## 2022-11-17 LAB — CBC WITH DIFFERENTIAL/PLATELET
Abs Immature Granulocytes: 0.01 10*3/uL (ref 0.00–0.07)
Basophils Absolute: 0 10*3/uL (ref 0.0–0.1)
Basophils Relative: 0 %
Eosinophils Absolute: 0.1 10*3/uL (ref 0.0–0.5)
Eosinophils Relative: 2 %
HCT: 38.7 % (ref 36.0–46.0)
Hemoglobin: 12.9 g/dL (ref 12.0–15.0)
Immature Granulocytes: 0 %
Lymphocytes Relative: 28 %
Lymphs Abs: 1.4 10*3/uL (ref 0.7–4.0)
MCH: 31.5 pg (ref 26.0–34.0)
MCHC: 33.3 g/dL (ref 30.0–36.0)
MCV: 94.6 fL (ref 80.0–100.0)
Monocytes Absolute: 0.5 10*3/uL (ref 0.1–1.0)
Monocytes Relative: 10 %
Neutro Abs: 3 10*3/uL (ref 1.7–7.7)
Neutrophils Relative %: 60 %
Platelets: 208 10*3/uL (ref 150–400)
RBC: 4.09 MIL/uL (ref 3.87–5.11)
RDW: 13.4 % (ref 11.5–15.5)
WBC: 5.1 10*3/uL (ref 4.0–10.5)
nRBC: 0 % (ref 0.0–0.2)

## 2022-11-17 LAB — PROTEIN / CREATININE RATIO, URINE
Creatinine, Urine: 32 mg/dL
Protein Creatinine Ratio: 3.75 mg/mg{Cre} — ABNORMAL HIGH (ref 0.00–0.15)
Total Protein, Urine: 120 mg/dL

## 2022-11-17 NOTE — Progress Notes (Unsigned)
Patient has concerns regarding her medication list. She also has protein in her urine that is concerning her.

## 2022-11-17 NOTE — Progress Notes (Unsigned)
[facility]  Telephone:([phone] Fax:(336) [phone]  ID: Roxy Manns OB: 1940/12/30  MR#: [id number]  OVF#:[id number]  Patient Care Team: Louis Matte, MD as PCP - General (Internal Medicine) Michaelyn Barter, MD as Consulting Physician (Oncology)  REFERRING PROVIDER: Dr. Sumner Boast, MD  REASON FOR REFERRAL: MGUS  HPI: Natasha Murray is a 82 y.o. female with past medical history of CKD, hypertension, hyperlipidemia, GERD, CAD referred to hematology with concerns for MGUS.  Patient reports history of CKD.  Tells me she had recent blood work couple of weeks ago with her primary and her protein level was elevated and was advised to see hematologist.  She is feeling great overall. Patient denies fever, chills, nausea, vomiting, shortness of breath, cough, abdominal pain, bleeding, bowel or bladder issues. Energy level is good.  Appetite is good.  Denies any weight loss. Denies pain.  Information obtained from the chart.  01/22/2022 -her case was reviewed by in-house nephrology.  ANCA negative, ANA positive, HIV/hep C/hep B negative.  SPEP/IFE elevated IgG and IgA level.  She was advised to avoid nephrotoxic drugs.  Renal ultrasound on 10/12/2021 showed left 5 mm and right 8 mm renal cyst otherwise normal.  Severe proteinuria.  Patient is on valsartan.  Skeletal survey from 06/09/2022 showed diffuse osteopenia.  Questionable subtle lucencies noted in the skull.  Although this could represent venous lakes tiny lytic lesions cannot be excluded.  Tiny lucency in the mid right radius cannot be completely excluded.  Diffuse multifocal degenerative change.  I do not have recent labs in our system.  Patient's daughter will reach out to primary's office and we will have them send to [place].  REVIEW OF SYSTEMS:   ROS  As per HPI. Otherwise, a complete review of systems is negative.  PAST MEDICAL HISTORY: Past Medical History:  Diagnosis Date   CAD  (coronary artery disease)    GERD (gastroesophageal reflux disease)    HLD (hyperlipidemia)    Hypertension    MI, old 83   Myocardial infarction (HCC) 10/14/2011    PAST SURGICAL HISTORY: Past Surgical History:  Procedure Laterality Date   ABDOMINAL HYSTERECTOMY     BREAST EXCISIONAL BIOPSY Left    surgical bx neg ? late 1990's   COLONOSCOPY WITH PROPOFOL N/A 03/13/2016   Procedure: COLONOSCOPY WITH PROPOFOL;  Surgeon: Midge Minium, MD;  Location: [facility];  Service: Endoscopy;  Laterality: N/A;   CORONARY ARTERY BYPASS GRAFT  10/15/2011   5 vessel. [facility]   ESOPHAGOGASTRODUODENOSCOPY (EGD) WITH PROPOFOL N/A 03/13/2016   Procedure: ESOPHAGOGASTRODUODENOSCOPY (EGD) WITH PROPOFOL;  Surgeon: Midge Minium, MD;  Location: [facility];  Service: Endoscopy;  Laterality: N/A;   POLYPECTOMY N/A 03/13/2016   Procedure: POLYPECTOMY;  Surgeon: Midge Minium, MD;  Location: [facility];  Service: Endoscopy;  Laterality: N/A;    FAMILY HISTORY: Family History  Problem Relation Age of Onset   CAD Mother    Hypertension Mother    Peripheral vascular disease Father    Breast cancer Sister 45   Cancer Brother 65    HEALTH MAINTENANCE: Social History   Tobacco Use   Smoking status: Never   Smokeless tobacco: Never  Vaping Use   Vaping status: Never Used  Substance Use Topics   Alcohol use: Never   Drug use: Never     Allergies  Allergen Reactions   Codeine Nausea And Vomiting and Nausea Only    Current Outpatient Medications  Medication Sig Dispense Refill  allopurinol (ZYLOPRIM) 100 MG tablet Take 100 mg by mouth daily.     amLODipine (NORVASC) 5 MG tablet TAKE 1 TABLET BY MOUTH TWICE A DAY 180 tablet 1   aspirin EC 81 MG tablet Take 81 mg by mouth daily.     busPIRone (BUSPAR) 7.5 MG tablet Take 7.5 mg by mouth 3 (three) times daily.     hydrochlorothiazide (HYDRODIURIL) 25 MG tablet TAKE 1 TABLET BY MOUTH EVERY DAY 90 tablet 3   metoprolol tartrate  (LOPRESSOR) 100 MG tablet TAKE 1 TABLET BY MOUTH TWICE A DAY 180 tablet 4   MITIGARE 0.6 MG CAPS TAKE 1 TABLET BY MOUTH 2 TIMES DAILY. (COVERS MITIGARE) 60 capsule 6   nitroGLYCERIN (NITROSTAT) 0.4 MG SL tablet Place 1 tablet (0.4 mg total) under the tongue every 5 (five) minutes as needed for chest pain. 50 tablet 3   pantoprazole (PROTONIX) 20 MG tablet TAKE 1 TABLET BY MOUTH EVERY DAY 90 tablet 1   rosuvastatin (CRESTOR) 20 MG tablet Take 1 tablet (20 mg total) by mouth daily. 90 tablet 3   valsartan (DIOVAN) 80 MG tablet Take 1 tablet (80 mg total) by mouth 2 (two) times daily. 180 tablet 2   ALPRAZolam (XANAX) 0.25 MG tablet Take 1 tablet (0.25 mg total) by mouth at bedtime. (Patient not taking: Reported on 11/17/2022) 30 tablet 1   lisinopril (ZESTRIL) 5 MG tablet Take 5 mg by mouth daily. (Patient not taking: Reported on 11/17/2022)     pravastatin (PRAVACHOL) 20 MG tablet Take by mouth. (Patient not taking: Reported on 11/17/2022)     traMADol (ULTRAM) 50 MG tablet SMARTSIG:0.5 Tablet(s) By Mouth Every 12 Hours PRN (Patient not taking: Reported on 02/24/2022)     valsartan (DIOVAN) 160 MG tablet Take 160 mg by mouth 2 (two) times daily.     No current facility-administered medications for this visit.    OBJECTIVE: Vitals:   11/17/22 1443  BP: (!) 153/76  Pulse: 61  Resp: 18  Temp: (!) 96.6 F (35.9 C)  SpO2: 100%     Body mass index is 33.38 kg/m.      General: Well-developed, well-nourished, no acute distress. Eyes: Pink conjunctiva, anicteric sclera. HEENT: Normocephalic, moist mucous membranes, clear oropharnyx. Lungs: Clear to auscultation bilaterally. Heart: Regular rate and rhythm. No rubs, murmurs, or gallops. Abdomen: Soft, nontender, nondistended. No organomegaly noted, normoactive bowel sounds. Musculoskeletal: No edema, cyanosis, or clubbing. Neuro: Alert, answering all questions appropriately. Cranial nerves grossly intact. Skin: No rashes or petechiae  noted. Psych: Normal affect. Lymphatics: No cervical, calvicular, axillary or inguinal LAD.   LAB RESULTS:  Lab Results  Component Value Date   NA 137 08/12/2021   K 3.5 08/12/2021   CL 99 08/12/2021   CO2 26 08/12/2021   GLUCOSE 90 08/12/2021   BUN 25 08/12/2021   CREATININE 1.60 (H) 08/12/2021   CALCIUM 8.9 08/12/2021   PROT 8.0 08/12/2021   ALBUMIN 3.9 01/28/2016   AST 18 08/12/2021   ALT 14 08/12/2021   ALKPHOS 90 01/28/2016   BILITOT 0.4 08/12/2021   GFRNONAA 33 (L) 11/24/2019   GFRAA 38 (L) 11/24/2019    Lab Results  Component Value Date   WBC 3.6 (L) 08/12/2021   NEUTROABS 1,753 08/12/2021   HGB 12.6 08/12/2021   HCT 37.5 08/12/2021   MCV 93.8 08/12/2021   PLT 228 08/12/2021    No results found for: "TIBC", "FERRITIN", "IRONPCTSAT"   STUDIES: MM 3D SCREENING MAMMOGRAM BILATERAL BREAST  Result Date: 10/29/2022  CLINICAL DATA:  Screening. EXAM: DIGITAL SCREENING BILATERAL MAMMOGRAM WITH TOMOSYNTHESIS AND CAD TECHNIQUE: Bilateral screening digital craniocaudal and mediolateral oblique mammograms were obtained. Bilateral screening digital breast tomosynthesis was performed. The images were evaluated with computer-aided detection. COMPARISON:  Previous exam(s). ACR Breast Density Category b: There are scattered areas of fibroglandular density. FINDINGS: There are no findings suspicious for malignancy. IMPRESSION: No mammographic evidence of malignancy. A result letter of this screening mammogram will be mailed directly to the patient. RECOMMENDATION: Screening mammogram in one year. (Code:SM-B-01Y) BI-RADS CATEGORY  1: Negative. Electronically Signed   By: Bary Richard M.D.   On: 10/29/2022 14:22    ASSESSMENT AND PLAN:   IRVIN HIBBITT is a 82 y.o. female with pmh of CKD, hypertension, hyperlipidemia, GERD, CAD referred to hematology with concerns for MGUS.  # CKD stage III # Elevated total protein -Patient was referred to hematology with concern for MGUS.  She  had labs 2 weeks ago but unfortunately do not have copy of the results.  Patient and daughter is agreeable to repeat myeloma panel today.  Daughter will also reach out to primary care office and have the results faxed to [place].  I discussed with the patient and daughter that I would wait to have the initial blood work done first and after that decide if she would need further testing with bone marrow biopsy, skeletal survey. - Skeletal survey from 06/09/2022 showed diffuse osteopenia.  Questionable subtle lucencies noted in the skull.  Although this could represent venous lakes tiny lytic lesions cannot be excluded.  Tiny lucency in the mid right radius cannot be completely excluded.  Diffuse multifocal degenerative change. -On prior UA, 3+ proteinuria.  She is also referred to Dr. Cherylann Ratel and scheduled to see on 11/20/2022.  She has chronic risk factors such as hypertension, hyperlipidemia, CAD.  # Hypertension -On amlodipine, HCTZ, valsartan, metoprolol  # Hyperlipidemia-on Crestor  Orders Placed This Encounter  Procedures   CBC with Differential/Platelet   Comprehensive metabolic panel   Kappa/lambda light chains   Multiple Myeloma Panel (SPEP&IFE w/QIG)   Immunofixation, urine   Protein / Creatinine Ratio, Urine   RTC in 2 weeks for MD visit to discuss labs.  Patient expressed understanding and was in agreement with this plan. She also understands that She can call clinic at any time with any questions, concerns, or complaints.   I spent a total of 45 minutes reviewing chart data, face-to-face evaluation with the patient, counseling and coordination of care as detailed above.  Michaelyn Barter, MD   11/17/2022 2:54 PM

## 2022-11-18 DIAGNOSIS — R778 Other specified abnormalities of plasma proteins: Secondary | ICD-10-CM | POA: Insufficient documentation

## 2022-11-26 ENCOUNTER — Other Ambulatory Visit: Payer: Self-pay | Admitting: Nephrology

## 2022-11-26 DIAGNOSIS — N1832 Chronic kidney disease, stage 3b: Secondary | ICD-10-CM

## 2022-11-26 DIAGNOSIS — R809 Proteinuria, unspecified: Secondary | ICD-10-CM

## 2022-11-28 ENCOUNTER — Inpatient Hospital Stay: Payer: Medicare PPO | Admitting: Internal Medicine

## 2022-11-28 VITALS — BP 156/63 | HR 56 | Wt 158.0 lb

## 2022-11-28 DIAGNOSIS — N1832 Chronic kidney disease, stage 3b: Secondary | ICD-10-CM

## 2022-11-28 DIAGNOSIS — R778 Other specified abnormalities of plasma proteins: Secondary | ICD-10-CM

## 2022-11-28 DIAGNOSIS — D472 Monoclonal gammopathy: Secondary | ICD-10-CM | POA: Diagnosis not present

## 2022-11-28 DIAGNOSIS — R768 Other specified abnormal immunological findings in serum: Secondary | ICD-10-CM | POA: Diagnosis not present

## 2022-11-28 NOTE — Progress Notes (Signed)
Patient states that she is doing well, no new questions or concerns for Dr. Alena Bills today.

## 2022-11-28 NOTE — Progress Notes (Signed)
[facility]  Telephone:(336) [phone] Fax:(336) [phone]  ID: Natasha Murray OB: [birth year]  MR#: [id number]  GUY#:[id number]  Patient Care Team: Louis Matte, MD as PCP - General (Internal Medicine) Michaelyn Barter, MD as Consulting Physician (Oncology)  REFERRING PROVIDER: Dr. Sumner Boast, MD  REASON FOR REFERRAL: MGUS  HPI: Natasha Murray is a 82 y.o. female with past medical history of CKD, hypertension, hyperlipidemia, GERD, CAD referred to hematology with concerns for MGUS.  Information obtained from the chart.  01/22/2022 -her case was reviewed by in-house nephrology.  ANCA negative, ANA positive, HIV/hep C/hep B negative.  SPEP/IFE elevated IgG and IgA level.  She was advised to avoid nephrotoxic drugs.  Renal ultrasound on 10/12/2021 showed left 5 mm and right 8 mm renal cyst otherwise normal.  Severe proteinuria.  Patient is on valsartan.  Skeletal survey from 06/09/2022 showed diffuse osteopenia.  Questionable subtle lucencies noted in the skull.  Although this could represent venous lakes tiny lytic lesions cannot be excluded.  Tiny lucency in the mid right radius cannot be completely excluded.  Diffuse multifocal degenerative change.  Follows with Dr. Cherylann Ratel.  Patient has known proteinuria since 2017.  CKD is likely secondary to chronic hypertension.  Interval history Patient was seen today as follow-up accompanied by daughter. She has been feeling well overall.  Denies any concerns.  REVIEW OF SYSTEMS:   ROS  As per HPI. Otherwise, a complete review of systems is negative.  PAST MEDICAL HISTORY: Past Medical History:  Diagnosis Date   CAD (coronary artery disease)    GERD (gastroesophageal reflux disease)    HLD (hyperlipidemia)    Hypertension    MI, old 41   Myocardial infarction (HCC) 10/14/2011    PAST SURGICAL HISTORY: Past Surgical History:  Procedure Laterality Date   ABDOMINAL HYSTERECTOMY     BREAST EXCISIONAL  BIOPSY Left    surgical bx neg ? late 1990's   COLONOSCOPY WITH PROPOFOL N/A 03/13/2016   Procedure: COLONOSCOPY WITH PROPOFOL;  Surgeon: Midge Minium, MD;  Location: [facility];  Service: Endoscopy;  Laterality: N/A;   CORONARY ARTERY BYPASS GRAFT  10/15/2011   5 vessel. [facility]   ESOPHAGOGASTRODUODENOSCOPY (EGD) WITH PROPOFOL N/A 03/13/2016   Procedure: ESOPHAGOGASTRODUODENOSCOPY (EGD) WITH PROPOFOL;  Surgeon: Midge Minium, MD;  Location: [facility];  Service: Endoscopy;  Laterality: N/A;   POLYPECTOMY N/A 03/13/2016   Procedure: POLYPECTOMY;  Surgeon: Midge Minium, MD;  Location: [facility];  Service: Endoscopy;  Laterality: N/A;    FAMILY HISTORY: Family History  Problem Relation Age of Onset   CAD Mother    Hypertension Mother    Peripheral vascular disease Father    Breast cancer Sister 25   Cancer Brother 28    HEALTH MAINTENANCE: Social History   Tobacco Use   Smoking status: Never   Smokeless tobacco: Never  Vaping Use   Vaping status: Never Used  Substance Use Topics   Alcohol use: Never   Drug use: Never     Allergies  Allergen Reactions   Codeine Nausea And Vomiting and Nausea Only    Current Outpatient Medications  Medication Sig Dispense Refill   allopurinol (ZYLOPRIM) 100 MG tablet Take 100 mg by mouth daily.     amLODipine (NORVASC) 5 MG tablet TAKE 1 TABLET BY MOUTH TWICE A DAY 180 tablet 1   aspirin EC 81 MG tablet Take 81 mg by mouth daily.     busPIRone (BUSPAR) 7.5 MG tablet Take 7.5 mg  by mouth 3 (three) times daily.     hydrochlorothiazide (HYDRODIURIL) 25 MG tablet TAKE 1 TABLET BY MOUTH EVERY DAY 90 tablet 3   metoprolol tartrate (LOPRESSOR) 100 MG tablet TAKE 1 TABLET BY MOUTH TWICE A DAY 180 tablet 4   MITIGARE 0.6 MG CAPS TAKE 1 TABLET BY MOUTH 2 TIMES DAILY. (COVERS MITIGARE) 60 capsule 6   nitroGLYCERIN (NITROSTAT) 0.4 MG SL tablet Place 1 tablet (0.4 mg total) under the tongue every 5 (five) minutes as needed for chest  pain. 50 tablet 3   pantoprazole (PROTONIX) 20 MG tablet TAKE 1 TABLET BY MOUTH EVERY DAY 90 tablet 1   rosuvastatin (CRESTOR) 20 MG tablet Take 1 tablet (20 mg total) by mouth daily. 90 tablet 3   valsartan (DIOVAN) 160 MG tablet Take 160 mg by mouth 2 (two) times daily.     valsartan (DIOVAN) 80 MG tablet Take 1 tablet (80 mg total) by mouth 2 (two) times daily. 180 tablet 2   ALPRAZolam (XANAX) 0.25 MG tablet Take 1 tablet (0.25 mg total) by mouth at bedtime. (Patient not taking: Reported on 11/17/2022) 30 tablet 1   lisinopril (ZESTRIL) 5 MG tablet Take 5 mg by mouth daily. (Patient not taking: Reported on 11/17/2022)     pravastatin (PRAVACHOL) 20 MG tablet Take by mouth. (Patient not taking: Reported on 11/17/2022)     traMADol (ULTRAM) 50 MG tablet SMARTSIG:0.5 Tablet(s) By Mouth Every 12 Hours PRN (Patient not taking: Reported on 02/24/2022)     No current facility-administered medications for this visit.    OBJECTIVE: Vitals:   11/28/22 1005  BP: (!) 156/63  Pulse: (!) 56  SpO2: 100%     Body mass index is 33.02 kg/m.      General: Well-developed, well-nourished, no acute distress. Eyes: Pink conjunctiva, anicteric sclera. HEENT: Normocephalic, moist mucous membranes, clear oropharnyx. Lungs: Clear to auscultation bilaterally. Heart: Regular rate and rhythm. No rubs, murmurs, or gallops. Abdomen: Soft, nontender, nondistended. No organomegaly noted, normoactive bowel sounds. Musculoskeletal: No edema, cyanosis, or clubbing. Neuro: Alert, answering all questions appropriately. Cranial nerves grossly intact. Skin: No rashes or petechiae noted. Psych: Normal affect. Lymphatics: No cervical, calvicular, axillary or inguinal LAD.   LAB RESULTS:  Lab Results  Component Value Date   NA 132 (L) 11/17/2022   K 3.7 11/17/2022   CL 96 (L) 11/17/2022   CO2 25 11/17/2022   GLUCOSE 102 (H) 11/17/2022   BUN 23 11/17/2022   CREATININE 1.37 (H) 11/17/2022   CALCIUM 9.2 11/17/2022    PROT 10.0 (H) 11/17/2022   ALBUMIN 4.2 11/17/2022   AST 44 (H) 11/17/2022   ALT 40 11/17/2022   ALKPHOS 105 11/17/2022   BILITOT 0.8 11/17/2022   GFRNONAA 39 (L) 11/17/2022   GFRAA 38 (L) 11/24/2019    Lab Results  Component Value Date   WBC 5.1 11/17/2022   NEUTROABS 3.0 11/17/2022   HGB 12.9 11/17/2022   HCT 38.7 11/17/2022   MCV 94.6 11/17/2022   PLT 208 11/17/2022    No results found for: "TIBC", "FERRITIN", "IRONPCTSAT"   STUDIES: No results found.  ASSESSMENT AND PLAN:   Natasha Murray is a 82 y.o. female with pmh of CKD, hypertension, hyperlipidemia, GERD, CAD referred to hematology with concerns for MGUS.  # CKD stage III # Elevated total protein -Patient was referred to hematology with concern for MGUS. SPEP/IFE no M protein, polyclonal immunoglobulin increase detected, urine immunofixation no M protein.  Kappa 138, lambda 52 with a ratio  2.63.  On CBC hemoglobin normal 12.9, creatinine fluctuating between 1.3-1.6.  Has known proteinuria since 2017.  Follows with Dr. Cherylann Ratel.  On lab work, there is no evidence of monoclonal protein.  Suspicion for plasma cell dyscrasia is low.  Elevation in kappa lambda chain is likely secondary to CKD which may be related to her chronic hypertension.  -She had skeletal survey from 06/09/2022 showed diffuse osteopenia.  Questionable subtle lucencies noted in the skull.  Although this could represent venous lakes tiny lytic lesions cannot be excluded.  Tiny lucency in the mid right radius cannot be completely excluded.  Diffuse multifocal degenerative change.  I will obtain new skeletal survey to assess for any evolution in bone lesion.  If concerning, will obtain PET CT scan.  # Hypertension -On amlodipine, HCTZ, valsartan, metoprolol  # Hyperlipidemia-on Crestor  Orders Placed This Encounter  Procedures   DG Bone Survey Met   CBC with Differential (Cancer Center Only)   CMP (Cancer Center only)   Kappa/lambda light chains    Multiple Myeloma Panel (SPEP&IFE w/QIG)   RTC in 6 months for MD visit, labs 2 weeks prior.  Patient expressed understanding and was in agreement with this plan. She also understands that She can call clinic at any time with any questions, concerns, or complaints.   I spent a total of 30 minutes reviewing chart data, face-to-face evaluation with the patient, counseling and coordination of care as detailed above.  Michaelyn Barter, MD   11/28/2022 12:30 PM

## 2022-12-01 ENCOUNTER — Ambulatory Visit: Payer: Medicare PPO | Admitting: Internal Medicine

## 2022-12-02 ENCOUNTER — Ambulatory Visit
Admission: RE | Admit: 2022-12-02 | Discharge: 2022-12-02 | Disposition: A | Discharge status: Home / Self Care | Payer: Medicare PPO | Source: Ambulatory Visit | Attending: Nephrology | Admitting: Nephrology

## 2022-12-02 DIAGNOSIS — N1832 Chronic kidney disease, stage 3b: Secondary | ICD-10-CM | POA: Diagnosis present

## 2022-12-02 DIAGNOSIS — R809 Proteinuria, unspecified: Secondary | ICD-10-CM

## 2023-05-18 ENCOUNTER — Inpatient Hospital Stay: Payer: Medicare PPO | Attending: Internal Medicine

## 2023-05-18 DIAGNOSIS — N1832 Chronic kidney disease, stage 3b: Secondary | ICD-10-CM

## 2023-05-18 DIAGNOSIS — R778 Other specified abnormalities of plasma proteins: Secondary | ICD-10-CM

## 2023-05-18 DIAGNOSIS — N183 Chronic kidney disease, stage 3 unspecified: Secondary | ICD-10-CM | POA: Diagnosis not present

## 2023-05-18 DIAGNOSIS — E785 Hyperlipidemia, unspecified: Secondary | ICD-10-CM | POA: Diagnosis not present

## 2023-05-18 DIAGNOSIS — R809 Proteinuria, unspecified: Secondary | ICD-10-CM | POA: Diagnosis not present

## 2023-05-18 DIAGNOSIS — M8580 Other specified disorders of bone density and structure, unspecified site: Secondary | ICD-10-CM | POA: Insufficient documentation

## 2023-05-18 DIAGNOSIS — R768 Other specified abnormal immunological findings in serum: Secondary | ICD-10-CM | POA: Insufficient documentation

## 2023-05-18 DIAGNOSIS — I129 Hypertensive chronic kidney disease with stage 1 through stage 4 chronic kidney disease, or unspecified chronic kidney disease: Secondary | ICD-10-CM | POA: Insufficient documentation

## 2023-05-18 DIAGNOSIS — Z803 Family history of malignant neoplasm of breast: Secondary | ICD-10-CM | POA: Insufficient documentation

## 2023-05-18 DIAGNOSIS — Z79899 Other long term (current) drug therapy: Secondary | ICD-10-CM | POA: Insufficient documentation

## 2023-05-18 DIAGNOSIS — D472 Monoclonal gammopathy: Secondary | ICD-10-CM

## 2023-05-18 LAB — CBC WITH DIFFERENTIAL (CANCER CENTER ONLY)
Abs Immature Granulocytes: 0.01 K/uL (ref 0.00–0.07)
Basophils Absolute: 0 K/uL (ref 0.0–0.1)
Basophils Relative: 0 %
Eosinophils Absolute: 0.2 K/uL (ref 0.0–0.5)
Eosinophils Relative: 4 %
HCT: 36.4 % (ref 36.0–46.0)
Hemoglobin: 12.3 g/dL (ref 12.0–15.0)
Immature Granulocytes: 0 %
Lymphocytes Relative: 36 %
Lymphs Abs: 1.6 K/uL (ref 0.7–4.0)
MCH: 31.9 pg (ref 26.0–34.0)
MCHC: 33.8 g/dL (ref 30.0–36.0)
MCV: 94.3 fL (ref 80.0–100.0)
Monocytes Absolute: 0.5 K/uL (ref 0.1–1.0)
Monocytes Relative: 11 %
Neutro Abs: 2.1 K/uL (ref 1.7–7.7)
Neutrophils Relative %: 49 %
Platelet Count: 201 K/uL (ref 150–400)
RBC: 3.86 MIL/uL — ABNORMAL LOW (ref 3.87–5.11)
RDW: 13.2 % (ref 11.5–15.5)
WBC Count: 4.3 K/uL (ref 4.0–10.5)
nRBC: 0 % (ref 0.0–0.2)

## 2023-05-18 LAB — CMP (CANCER CENTER ONLY)
ALT: 31 U/L (ref 0–44)
AST: 37 U/L (ref 15–41)
Albumin: 3.8 g/dL (ref 3.5–5.0)
Alkaline Phosphatase: 114 U/L (ref 38–126)
Anion gap: 9 (ref 5–15)
BUN: 31 mg/dL — ABNORMAL HIGH (ref 8–23)
CO2: 25 mmol/L (ref 22–32)
Calcium: 9 mg/dL (ref 8.9–10.3)
Chloride: 97 mmol/L — ABNORMAL LOW (ref 98–111)
Creatinine: 1.64 mg/dL — ABNORMAL HIGH (ref 0.44–1.00)
GFR, Estimated: 31 mL/min — ABNORMAL LOW
Glucose, Bld: 104 mg/dL — ABNORMAL HIGH (ref 70–99)
Potassium: 3.6 mmol/L (ref 3.5–5.1)
Sodium: 131 mmol/L — ABNORMAL LOW (ref 135–145)
Total Bilirubin: 0.6 mg/dL (ref 0.0–1.2)
Total Protein: 9.1 g/dL — ABNORMAL HIGH (ref 6.5–8.1)

## 2023-05-19 LAB — KAPPA/LAMBDA LIGHT CHAINS
Kappa free light chain: 152.6 mg/L — ABNORMAL HIGH (ref 3.3–19.4)
Kappa, lambda light chain ratio: 2.52 — ABNORMAL HIGH (ref 0.26–1.65)
Lambda free light chains: 60.5 mg/L — ABNORMAL HIGH (ref 5.7–26.3)

## 2023-05-21 LAB — MULTIPLE MYELOMA PANEL, SERUM
Albumin SerPl Elph-Mcnc: 3.7 g/dL (ref 2.9–4.4)
Albumin/Glob SerPl: 0.8 (ref 0.7–1.7)
Alpha 1: 0.3 g/dL (ref 0.0–0.4)
Alpha2 Glob SerPl Elph-Mcnc: 0.8 g/dL (ref 0.4–1.0)
B-Globulin SerPl Elph-Mcnc: 1.1 g/dL (ref 0.7–1.3)
Gamma Glob SerPl Elph-Mcnc: 2.8 g/dL — ABNORMAL HIGH (ref 0.4–1.8)
Globulin, Total: 5 g/dL — ABNORMAL HIGH (ref 2.2–3.9)
IgA: 361 mg/dL (ref 64–422)
IgG (Immunoglobin G), Serum: 3242 mg/dL — ABNORMAL HIGH (ref 586–1602)
IgM (Immunoglobulin M), Srm: 231 mg/dL — ABNORMAL HIGH (ref 26–217)
Total Protein ELP: 8.7 g/dL — ABNORMAL HIGH (ref 6.0–8.5)

## 2023-06-01 ENCOUNTER — Encounter: Payer: Self-pay | Admitting: Internal Medicine

## 2023-06-01 ENCOUNTER — Telehealth: Payer: Self-pay | Admitting: Internal Medicine

## 2023-06-01 ENCOUNTER — Inpatient Hospital Stay: Payer: Medicare PPO | Admitting: Internal Medicine

## 2023-06-01 VITALS — BP 140/62 | HR 66 | Temp 97.6°F | Resp 16 | Wt 151.0 lb

## 2023-06-01 DIAGNOSIS — M899 Disorder of bone, unspecified: Secondary | ICD-10-CM | POA: Insufficient documentation

## 2023-06-01 DIAGNOSIS — R768 Other specified abnormal immunological findings in serum: Secondary | ICD-10-CM | POA: Diagnosis not present

## 2023-06-01 DIAGNOSIS — M898X9 Other specified disorders of bone, unspecified site: Secondary | ICD-10-CM

## 2023-06-01 DIAGNOSIS — D472 Monoclonal gammopathy: Secondary | ICD-10-CM | POA: Diagnosis not present

## 2023-06-01 DIAGNOSIS — R778 Other specified abnormalities of plasma proteins: Secondary | ICD-10-CM

## 2023-06-01 NOTE — Progress Notes (Signed)
 Patient is doing well, she just has some questions about the recent lab work she had done here about 2 weeks ago.

## 2023-06-01 NOTE — Telephone Encounter (Signed)
 Vm left with patient with detailed appointment information/instructions for her PET scan on 3/3.

## 2023-06-01 NOTE — Progress Notes (Signed)
 [facility]  Telephone:(336) [phone] Fax:(336) [phone]  ID: Natasha Murray OB: 01-Oct-1940  MR#: [id number]  AOZ#:[id number]  Patient Care Team: Louis Matte, MD as PCP - General (Internal Medicine) Michaelyn Barter, MD as Consulting Physician (Oncology)  REFERRING PROVIDER: Dr. Sumner Boast, MD  REASON FOR REFERRAL: MGUS  HPI: Natasha Murray is a 83 y.o. female with past medical history of CKD, hypertension, hyperlipidemia, GERD, CAD referred to hematology with concerns for MGUS.  Information obtained from the chart.   01/22/2022 -her case was reviewed by in-house nephrology.  ANCA negative, ANA positive, HIV/hep C/hep B negative.  SPEP/IFE elevated IgG and IgA level.  She was advised to avoid nephrotoxic drugs.  Renal ultrasound on 10/12/2021 showed left 5 mm and right 8 mm renal cyst otherwise normal.  Severe proteinuria.  Patient is on valsartan.  Skeletal survey from 06/09/2022 showed diffuse osteopenia.  Questionable subtle lucencies noted in the skull.  Although this could represent venous lakes tiny lytic lesions cannot be excluded.  Tiny lucency in the mid right radius cannot be completely excluded.  Diffuse multifocal degenerative change.  11/2022-SPEP with nephrology showed a faint band in gamma globulin region.  Immunofixation was not available. UPEP/IFE no M protein  11/17/2022- seen by hematology.  Repeat SPEP/IFE showed polyclonal increase in immunoglobulins.  05/18/23 - SPEP/IFE no M protein.  Polyclonal increase detected in immunoglobulins.  Kappa 152, lambda 60.5 with ratio 2.52 largely unchanged from 6 months ago.  Creatinine stable between 1.3-1.6.  CBC is unremarkable.  Patient has known elevated total protein level at least since 2013.  Follows with Dr. Cherylann Ratel.  Patient has known proteinuria since 2017.  CKD is likely secondary to chronic hypertension.  Interval history Patient was seen today as follow-up accompanied by  daughter. She has been feeling well overall.  Denies any concerns.  REVIEW OF SYSTEMS:   ROS  As per HPI. Otherwise, a complete review of systems is negative.  PAST MEDICAL HISTORY: Past Medical History:  Diagnosis Date   CAD (coronary artery disease)    GERD (gastroesophageal reflux disease)    HLD (hyperlipidemia)    Hypertension    MI, old 19   Myocardial infarction (HCC) 10/14/2011    PAST SURGICAL HISTORY: Past Surgical History:  Procedure Laterality Date   ABDOMINAL HYSTERECTOMY     BREAST EXCISIONAL BIOPSY Left    surgical bx neg ? late 1990's   COLONOSCOPY WITH PROPOFOL N/A 03/13/2016   Procedure: COLONOSCOPY WITH PROPOFOL;  Surgeon: Midge Minium, MD;  Location: [facility];  Service: Endoscopy;  Laterality: N/A;   CORONARY ARTERY BYPASS GRAFT  10/15/2011   5 vessel. [facility]   ESOPHAGOGASTRODUODENOSCOPY (EGD) WITH PROPOFOL N/A 03/13/2016   Procedure: ESOPHAGOGASTRODUODENOSCOPY (EGD) WITH PROPOFOL;  Surgeon: Midge Minium, MD;  Location: [facility];  Service: Endoscopy;  Laterality: N/A;   POLYPECTOMY N/A 03/13/2016   Procedure: POLYPECTOMY;  Surgeon: Midge Minium, MD;  Location: [facility];  Service: Endoscopy;  Laterality: N/A;    FAMILY HISTORY: Family History  Problem Relation Age of Onset   CAD Mother    Hypertension Mother    Peripheral vascular disease Father    Breast cancer Sister 47   Cancer Brother 34    HEALTH MAINTENANCE: Social History   Tobacco Use   Smoking status: Never   Smokeless tobacco: Never  Vaping Use   Vaping status: Never Used  Substance Use Topics   Alcohol use: Never   Drug use: Never  Allergies  Allergen Reactions   Codeine Nausea And Vomiting and Nausea Only    Current Outpatient Medications  Medication Sig Dispense Refill   allopurinol (ZYLOPRIM) 100 MG tablet Take 100 mg by mouth daily.     ALPRAZolam (XANAX) 0.25 MG tablet Take 1 tablet (0.25 mg total) by mouth at bedtime. 30 tablet 1    amLODipine (NORVASC) 5 MG tablet TAKE 1 TABLET BY MOUTH TWICE A DAY 180 tablet 1   aspirin EC 81 MG tablet Take 81 mg by mouth daily.     busPIRone (BUSPAR) 7.5 MG tablet Take 7.5 mg by mouth 3 (three) times daily.     hydrochlorothiazide (HYDRODIURIL) 25 MG tablet TAKE 1 TABLET BY MOUTH EVERY DAY 90 tablet 3   lisinopril (ZESTRIL) 5 MG tablet Take 5 mg by mouth daily.     metoprolol tartrate (LOPRESSOR) 100 MG tablet TAKE 1 TABLET BY MOUTH TWICE A DAY 180 tablet 4   MITIGARE 0.6 MG CAPS TAKE 1 TABLET BY MOUTH 2 TIMES DAILY. (COVERS MITIGARE) 60 capsule 6   nitroGLYCERIN (NITROSTAT) 0.4 MG SL tablet Place 1 tablet (0.4 mg total) under the tongue every 5 (five) minutes as needed for chest pain. 50 tablet 3   pantoprazole (PROTONIX) 20 MG tablet TAKE 1 TABLET BY MOUTH EVERY DAY 90 tablet 1   pravastatin (PRAVACHOL) 20 MG tablet Take by mouth.     rosuvastatin (CRESTOR) 20 MG tablet Take 1 tablet (20 mg total) by mouth daily. 90 tablet 3   valsartan (DIOVAN) 160 MG tablet Take 160 mg by mouth 2 (two) times daily.     traMADol (ULTRAM) 50 MG tablet SMARTSIG:0.5 Tablet(s) By Mouth Every 12 Hours PRN (Patient not taking: Reported on 06/01/2023)     valsartan (DIOVAN) 80 MG tablet Take 1 tablet (80 mg total) by mouth 2 (two) times daily. (Patient not taking: Reported on 06/01/2023) 180 tablet 2   No current facility-administered medications for this visit.    OBJECTIVE: Vitals:   06/01/23 0904  BP: (!) 140/62  Pulse: 66  Resp: 16  Temp: 97.6 F (36.4 C)  SpO2: 99%     Body mass index is 31.56 kg/m.      General: Well-developed, well-nourished, no acute distress. Eyes: Pink conjunctiva, anicteric sclera. HEENT: Normocephalic, moist mucous membranes, clear oropharnyx. Lungs: Clear to auscultation bilaterally. Heart: Regular rate and rhythm. No rubs, murmurs, or gallops. Abdomen: Soft, nontender, nondistended. No organomegaly noted, normoactive bowel sounds. Musculoskeletal: No edema,  cyanosis, or clubbing. Neuro: Alert, answering all questions appropriately. Cranial nerves grossly intact. Skin: No rashes or petechiae noted. Psych: Normal affect. Lymphatics: No cervical, calvicular, axillary or inguinal LAD.   LAB RESULTS:  Lab Results  Component Value Date   NA 131 (L) 05/18/2023   K 3.6 05/18/2023   CL 97 (L) 05/18/2023   CO2 25 05/18/2023   GLUCOSE 104 (H) 05/18/2023   BUN 31 (H) 05/18/2023   CREATININE 1.64 (H) 05/18/2023   CALCIUM 9.0 05/18/2023   PROT 9.1 (H) 05/18/2023   ALBUMIN 3.8 05/18/2023   AST 37 05/18/2023   ALT 31 05/18/2023   ALKPHOS 114 05/18/2023   BILITOT 0.6 05/18/2023   GFRNONAA 31 (L) 05/18/2023   GFRAA 38 (L) 11/24/2019    Lab Results  Component Value Date   WBC 4.3 05/18/2023   NEUTROABS 2.1 05/18/2023   HGB 12.3 05/18/2023   HCT 36.4 05/18/2023   MCV 94.3 05/18/2023   PLT 201 05/18/2023    No results found  for: "TIBC", "FERRITIN", "IRONPCTSAT"   STUDIES: No results found.  ASSESSMENT AND PLAN:   Natasha Murray is a 83 y.o. female with pmh of CKD, hypertension, hyperlipidemia, GERD, CAD referred to hematology with concerns for MGUS.  # CKD stage III # Elevated total protein -Patient was referred to hematology with concern for MGUS in Aug 2024. SPEP/IFE no M protein, polyclonal immunoglobulin increase detected, urine immunofixation no M protein.  Kappa 138, lambda 52 with a ratio 2.63.  On CBC hemoglobin normal 12.9, creatinine fluctuating between 1.3-1.6.  Has known proteinuria since 2017 and elevated t. Protein since 2013.  Follows with Dr. Cherylann Ratel.  On lab work, there is no evidence of monoclonal protein.  Suspicion for plasma cell dyscrasia is low.  Elevation in kappa lambda chain is likely secondary to CKD which may be related to her chronic hypertension.  - Repeat from 05/18/23 - SPEP/IFE no M protein.  Polyclonal increase detected in immunoglobulins.  Kappa 152, lambda 60.5 with ratio 2.52 largely unchanged from 6  months ago.  Creatinine stable between 1.3-1.6.  CBC is unremarkable.  Labs overall stable.  -She had skeletal survey from 06/09/2022 showed diffuse osteopenia.  Questionable subtle lucencies noted in the skull.  Although this could represent venous lakes tiny lytic lesions cannot be excluded.  Tiny lucency in the mid right radius cannot be completely excluded.  Diffuse multifocal degenerative change.  For some reason, patient did not get skeletal survey done.  Will obtain PET whole-body to evaluate these bony lesions.  If scan is negative, can consider discharging the patient since on labs have not seen any presence of monoclonal protein concerning for MGUS.  # Hypertension -On amlodipine, HCTZ, valsartan, metoprolol  # Hyperlipidemia-on Crestor  Orders Placed This Encounter  Procedures   NM PET Image Initial (PI) Whole Body   RTC in 4 weeks for MD visit.  Patient expressed understanding and was in agreement with this plan. She also understands that She can call clinic at any time with any questions, concerns, or complaints.   I spent a total of 30 minutes reviewing chart data, face-to-face evaluation with the patient, counseling and coordination of care as detailed above.  Michaelyn Barter, MD   06/01/2023 11:33 AM

## 2023-06-08 ENCOUNTER — Ambulatory Visit
Admission: RE | Admit: 2023-06-08 | Discharge: 2023-06-08 | Disposition: A | Discharge status: Home / Self Care | Payer: Medicare PPO | Source: Ambulatory Visit | Attending: Internal Medicine | Admitting: Internal Medicine

## 2023-06-08 DIAGNOSIS — K449 Diaphragmatic hernia without obstruction or gangrene: Secondary | ICD-10-CM | POA: Insufficient documentation

## 2023-06-08 DIAGNOSIS — M899 Disorder of bone, unspecified: Secondary | ICD-10-CM | POA: Insufficient documentation

## 2023-06-08 DIAGNOSIS — D472 Monoclonal gammopathy: Secondary | ICD-10-CM | POA: Insufficient documentation

## 2023-06-08 DIAGNOSIS — D37030 Neoplasm of uncertain behavior of the parotid salivary glands: Secondary | ICD-10-CM | POA: Diagnosis not present

## 2023-06-08 LAB — GLUCOSE, CAPILLARY: Glucose-Capillary: 90 mg/dL (ref 70–99)

## 2023-06-08 MED ORDER — FLUDEOXYGLUCOSE F - 18 (FDG) INJECTION
8.2300 | Freq: Once | INTRAVENOUS | Status: AC | PRN
  Administered 2023-06-08: 8.23 via INTRAVENOUS

## 2023-06-29 ENCOUNTER — Encounter: Payer: Self-pay | Admitting: Internal Medicine

## 2023-06-29 ENCOUNTER — Inpatient Hospital Stay: Payer: Medicare PPO | Attending: Internal Medicine | Admitting: Internal Medicine

## 2023-06-29 VITALS — BP 138/62 | HR 56 | Temp 97.5°F | Resp 16 | Wt 158.0 lb

## 2023-06-29 DIAGNOSIS — R809 Proteinuria, unspecified: Secondary | ICD-10-CM | POA: Insufficient documentation

## 2023-06-29 DIAGNOSIS — D472 Monoclonal gammopathy: Secondary | ICD-10-CM | POA: Diagnosis not present

## 2023-06-29 DIAGNOSIS — R768 Other specified abnormal immunological findings in serum: Secondary | ICD-10-CM | POA: Diagnosis present

## 2023-06-29 DIAGNOSIS — M899 Disorder of bone, unspecified: Secondary | ICD-10-CM

## 2023-06-29 DIAGNOSIS — E785 Hyperlipidemia, unspecified: Secondary | ICD-10-CM | POA: Insufficient documentation

## 2023-06-29 DIAGNOSIS — M8580 Other specified disorders of bone density and structure, unspecified site: Secondary | ICD-10-CM | POA: Diagnosis not present

## 2023-06-29 DIAGNOSIS — N183 Chronic kidney disease, stage 3 unspecified: Secondary | ICD-10-CM | POA: Insufficient documentation

## 2023-06-29 DIAGNOSIS — R599 Enlarged lymph nodes, unspecified: Secondary | ICD-10-CM

## 2023-06-29 DIAGNOSIS — Z79899 Other long term (current) drug therapy: Secondary | ICD-10-CM | POA: Insufficient documentation

## 2023-06-29 DIAGNOSIS — I129 Hypertensive chronic kidney disease with stage 1 through stage 4 chronic kidney disease, or unspecified chronic kidney disease: Secondary | ICD-10-CM | POA: Insufficient documentation

## 2023-06-29 DIAGNOSIS — R590 Localized enlarged lymph nodes: Secondary | ICD-10-CM | POA: Insufficient documentation

## 2023-06-29 NOTE — Progress Notes (Signed)
 [facility]  Telephone:(336) [phone] Fax:(336) [phone]  ID: Roxy Manns OB: 1941/03/08  MR#: [id number]  AOZ#:[id number]  Patient Care Team: Louis Matte, MD as PCP - General (Internal Medicine) Michaelyn Barter, MD as Consulting Physician (Oncology)  REFERRING PROVIDER: Dr. Sumner Boast, MD  REASON FOR REFERRAL: MGUS  HPI: Natasha Murray is a 83 y.o. female with past medical history of CKD, hypertension, hyperlipidemia, GERD, CAD referred to hematology with concerns for MGUS.  Information obtained from the chart.   01/22/2022 -her case was reviewed by in-house nephrology.  ANCA negative, ANA positive, HIV/hep C/hep B negative.  SPEP/IFE elevated IgG and IgA level.  She was advised to avoid nephrotoxic drugs.  Renal ultrasound on 10/12/2021 showed left 5 mm and right 8 mm renal cyst otherwise normal.  Severe proteinuria.  Patient is on valsartan.  Skeletal survey from 06/09/2022 showed diffuse osteopenia.  Questionable subtle lucencies noted in the skull.  Although this could represent venous lakes tiny lytic lesions cannot be excluded.  Tiny lucency in the mid right radius cannot be completely excluded.  Diffuse multifocal degenerative change.  11/2022-SPEP with nephrology showed a faint band in gamma globulin region.  Immunofixation was not available. UPEP/IFE no M protein  11/17/2022- seen by hematology.  Repeat SPEP/IFE showed polyclonal increase in immunoglobulins.  05/18/23 - SPEP/IFE no M protein.  Polyclonal increase detected in immunoglobulins.  Kappa 152, lambda 60.5 with ratio 2.52 largely unchanged from 6 months ago.  Creatinine stable between 1.3-1.6.  CBC is unremarkable.  Patient did not have repeat skeletal surveys.  06/29/23 -PET scan was done to reevaluate the bony lesions. No hypermetabolic activity in the bone.  However showed incidental finding of abnormal lymph node involving the right parotid gland with SUV 16.2 measuring 15 x 9  mm.  Mild activity in the bilateral adrenal gland but no discrete masses, simple follow-up recommended in 61-month.   Patient has known elevated total protein level at least since 2013.  Follows with Dr. Cherylann Ratel.  Patient has known proteinuria since 2017.  CKD is likely secondary to chronic hypertension.   Interval history Patient was seen today as follow-up accompanied by daughter. She has been feeling well overall.  Denies any recent infections vaccination use or cold.  REVIEW OF SYSTEMS:   ROS  As per HPI. Otherwise, a complete review of systems is negative.  PAST MEDICAL HISTORY: Past Medical History:  Diagnosis Date   CAD (coronary artery disease)    GERD (gastroesophageal reflux disease)    HLD (hyperlipidemia)    Hypertension    MI, old 72   Myocardial infarction (HCC) 10/14/2011    PAST SURGICAL HISTORY: Past Surgical History:  Procedure Laterality Date   ABDOMINAL HYSTERECTOMY     BREAST EXCISIONAL BIOPSY Left    surgical bx neg ? late 1990's   COLONOSCOPY WITH PROPOFOL N/A 03/13/2016   Procedure: COLONOSCOPY WITH PROPOFOL;  Surgeon: Midge Minium, MD;  Location: [facility];  Service: Endoscopy;  Laterality: N/A;   CORONARY ARTERY BYPASS GRAFT  10/15/2011   5 vessel. [facility]   ESOPHAGOGASTRODUODENOSCOPY (EGD) WITH PROPOFOL N/A 03/13/2016   Procedure: ESOPHAGOGASTRODUODENOSCOPY (EGD) WITH PROPOFOL;  Surgeon: Midge Minium, MD;  Location: [facility];  Service: Endoscopy;  Laterality: N/A;   POLYPECTOMY N/A 03/13/2016   Procedure: POLYPECTOMY;  Surgeon: Midge Minium, MD;  Location: [facility];  Service: Endoscopy;  Laterality: N/A;    FAMILY HISTORY: Family History  Problem Relation Age of Onset   CAD Mother  Hypertension Mother    Peripheral vascular disease Father    Breast cancer Sister 42   Cancer Brother 58    HEALTH MAINTENANCE: Social History   Tobacco Use   Smoking status: Never   Smokeless tobacco: Never  Vaping Use   Vaping  status: Never Used  Substance Use Topics   Alcohol use: Never   Drug use: Never     Allergies  Allergen Reactions   Codeine Nausea And Vomiting and Nausea Only    Current Outpatient Medications  Medication Sig Dispense Refill   allopurinol (ZYLOPRIM) 100 MG tablet Take 100 mg by mouth daily.     ALPRAZolam (XANAX) 0.25 MG tablet Take 1 tablet (0.25 mg total) by mouth at bedtime. 30 tablet 1   amLODipine (NORVASC) 5 MG tablet TAKE 1 TABLET BY MOUTH TWICE A DAY 180 tablet 1   aspirin EC 81 MG tablet Take 81 mg by mouth daily.     busPIRone (BUSPAR) 7.5 MG tablet Take 7.5 mg by mouth 3 (three) times daily.     hydrochlorothiazide (HYDRODIURIL) 25 MG tablet TAKE 1 TABLET BY MOUTH EVERY DAY 90 tablet 3   lisinopril (ZESTRIL) 5 MG tablet Take 5 mg by mouth daily.     metoprolol tartrate (LOPRESSOR) 100 MG tablet TAKE 1 TABLET BY MOUTH TWICE A DAY 180 tablet 4   MITIGARE 0.6 MG CAPS TAKE 1 TABLET BY MOUTH 2 TIMES DAILY. (COVERS MITIGARE) 60 capsule 6   nitroGLYCERIN (NITROSTAT) 0.4 MG SL tablet Place 1 tablet (0.4 mg total) under the tongue every 5 (five) minutes as needed for chest pain. 50 tablet 3   pantoprazole (PROTONIX) 20 MG tablet TAKE 1 TABLET BY MOUTH EVERY DAY 90 tablet 1   pravastatin (PRAVACHOL) 20 MG tablet Take by mouth.     rosuvastatin (CRESTOR) 20 MG tablet Take 1 tablet (20 mg total) by mouth daily. 90 tablet 3   valsartan (DIOVAN) 160 MG tablet Take 160 mg by mouth 2 (two) times daily.     traMADol (ULTRAM) 50 MG tablet SMARTSIG:0.5 Tablet(s) By Mouth Every 12 Hours PRN (Patient not taking: Reported on 02/24/2022)     valsartan (DIOVAN) 80 MG tablet Take 1 tablet (80 mg total) by mouth 2 (two) times daily. (Patient not taking: Reported on 06/29/2023) 180 tablet 2   No current facility-administered medications for this visit.    OBJECTIVE: Vitals:   06/29/23 0954  BP: 138/62  Pulse: (!) 56  Resp: 16  Temp: (!) 97.5 F (36.4 C)  SpO2: 99%     Body mass index is  33.02 kg/m.      General: Well-developed, well-nourished, no acute distress. Eyes: Pink conjunctiva, anicteric sclera. HEENT: Normocephalic, moist mucous membranes, clear oropharnyx. Lungs: Clear to auscultation bilaterally. Heart: Regular rate and rhythm. No rubs, murmurs, or gallops. Abdomen: Soft, nontender, nondistended. No organomegaly noted, normoactive bowel sounds. Musculoskeletal: No edema, cyanosis, or clubbing. Neuro: Alert, answering all questions appropriately. Cranial nerves grossly intact. Skin: No rashes or petechiae noted. Psych: Normal affect. Lymphatics: No cervical, calvicular, axillary or inguinal LAD.   LAB RESULTS:  Lab Results  Component Value Date   NA 131 (L) 05/18/2023   K 3.6 05/18/2023   CL 97 (L) 05/18/2023   CO2 25 05/18/2023   GLUCOSE 104 (H) 05/18/2023   BUN 31 (H) 05/18/2023   CREATININE 1.64 (H) 05/18/2023   CALCIUM 9.0 05/18/2023   PROT 9.1 (H) 05/18/2023   ALBUMIN 3.8 05/18/2023   AST 37 05/18/2023  ALT 31 05/18/2023   ALKPHOS 114 05/18/2023   BILITOT 0.6 05/18/2023   GFRNONAA 31 (L) 05/18/2023   GFRAA 38 (L) 11/24/2019    Lab Results  Component Value Date   WBC 4.3 05/18/2023   NEUTROABS 2.1 05/18/2023   HGB 12.3 05/18/2023   HCT 36.4 05/18/2023   MCV 94.3 05/18/2023   PLT 201 05/18/2023    No results found for: "TIBC", "FERRITIN", "IRONPCTSAT"   STUDIES: NM PET Image Initial (PI) Whole Body Result Date: 06/25/2023 CLINICAL DATA:  Monoclonal gammopathy.  Lytic bone lesions EXAM: NUCLEAR MEDICINE PET WHOLE BODY TECHNIQUE: 8.23 mCi F-18 FDG was injected intravenously. Full-ring PET imaging was performed from the head to foot after the radiotracer. CT data was obtained and used for attenuation correction and anatomic localization. Fasting blood glucose: 90 mg/dl COMPARISON:  Bone survey 06/09/2022.  Abdomen pelvis CT 01/06/2016. FINDINGS: Mediastinal blood pool activity: SUV max 3.4 HEAD/NECK: Near symmetric uptake along the  intracranial compartment. There is some physiologic uptake along right posterior upper pharynx. Focal hypermetabolic lymph node involving the right parotid gland with maximum SUV of 16.2. Node measures 15 by 9 mm on CT image 45. No additional areas of abnormal radiotracer uptake in neck. Incidental CT findings: Visualized paranasal sinuses and mastoid air cells are clear. Scattered vascular calcifications. No other areas of nodal enlargement identified in the neck including submandibular, posterior triangle or internal jugular regions. Left parotid gland, the submandibular glands and thyroid gland are grossly preserved. CHEST: No abnormal uptake seen above mediastinal blood pool in the axillary region, hilum or mediastinum. No abnormal uptake along the lung parenchyma. Incidental CT findings: Status post median sternotomy. Heart is slightly enlarged. Prominent vascular calcifications along the thoracic aorta and coronary arteries. No discrete nodal enlargement identified in the axillary regions, hilum or mediastinum. There are some small retrocrural nodes. Prominent left axillary node which has a fatty hilum. Breathing motion along the lungs. No consolidation, pneumothorax or effusion. No dominant lung nodule. Upper lobe left-sided air cysts identified. ABDOMEN/PELVIS: There is mild uptake along the adrenal glands bilaterally. Maximum SUV on the left a 4.0 and right 5.1. The adrenal glands are slightly thickened but no discrete mass. Otherwise there is physiologic distribution along the parenchymal organs, bowel and renal collecting systems. Incidental CT findings: Grossly the liver, spleen, pancreas are unremarkable on this noncontrast examination. No abnormal calcifications are seen within either kidney nor along the course of either ureter. Preserved contour to the urinary bladder. Small hiatal hernia. Stomach and small bowel are nondilated. Moderate diffuse colonic stool. Left-sided colonic diverticulosis of the  descending and sigmoid colon. Gallbladder is present. Small fat containing epigastric midline anterior abdominal wall hernia. Prominent vascular calcifications identified along the aorta. Normal caliber aorta and IVC. SKELETON: No specific abnormal radiotracer uptake along the skeleton. Scattered degenerative changes are seen. Incidental CT findings: none EXTREMITIES: Normal radiotracer uptake seen along the soft tissues of the lower extremities. Including along inguinal regions. Physiologic muscular uptake along the soft tissues near the left knee and along both feet, left greater than right. Incidental CT findings: No areas of nodal enlargement along the inguinal regions. Surgical clips along both femoral regions. Prominent vascular calcifications along the lower extremities including into the trifurcation. IMPRESSION: Solitary hypermetabolic nodule along the right parotid gland inferiorly which may be an abnormal lymph node. Malignant lesion is possible. Recommend further evaluation. Percutaneous sampling would be 1 possibility. Slight uptake along the adrenal glands, nonspecific. No discrete mass lesion or nodularity to the  adrenals. Please correlate with any symptomatology. Simple short-term follow-up in 6 months. No specific abnormal uptake along the skeleton. Small hiatal hernia. Electronically Signed   By: Karen Kays M.D.   On: 06/25/2023 13:15    ASSESSMENT AND PLAN:   Natasha Murray is a 83 y.o. female with pmh of CKD, hypertension, hyperlipidemia, GERD, CAD referred to hematology with concerns for MGUS.  # CKD stage III # Elevated total protein -Patient was referred to hematology with concern for MGUS in Aug 2024. SPEP/IFE no M protein, polyclonal immunoglobulin increase detected, urine immunofixation no M protein.  Kappa 138, lambda 52 with a ratio 2.63.  On CBC hemoglobin normal 12.9, creatinine fluctuating between 1.3-1.6.  Has known proteinuria since 2017 and elevated t. Protein since  2013.  Follows with Dr. Cherylann Ratel.  On lab work, there is no evidence of monoclonal protein.  Suspicion for plasma cell dyscrasia is low.  Elevation in kappa lambda chain is likely secondary to CKD which may be related to her chronic hypertension.  -She had skeletal survey from 06/09/2022 showed diffuse osteopenia.  Questionable subtle lucencies noted in the skull.  Although this could represent venous lakes tiny lytic lesions cannot be excluded.  Tiny lucency in the mid right radius cannot be completely excluded.  Diffuse multifocal degenerative change.  For some reason, patient did not get skeletal survey done.    - PET scan was done to reevaluate the bony lesions. No hypermetabolic activity in the bone.  However showed incidental finding of abnormal lymph node involving the right parotid gland with SUV 16.2 measuring 15 x 9 mm.  Mild activity in the bilateral adrenal gland but no discrete masses, simple follow-up recommended in 4-month.  Will add the case to the tumor conference for radiology review to discuss about doing an ultrasound versus if accessible for biopsy.  I will sign out to Consuello Masse requesting to present the case.  Will reach out to the patient with recommendations.  # Hypertension -On amlodipine, HCTZ, valsartan, metoprolol  # Hyperlipidemia-on Crestor  No orders of the defined types were placed in this encounter.  RTC in 4 weeks for MD visit.  Patient expressed understanding and was in agreement with this plan. She also understands that She can call clinic at any time with any questions, concerns, or complaints.   I spent a total of 30 minutes reviewing chart data, face-to-face evaluation with the patient, counseling and coordination of care as detailed above.  Michaelyn Barter, MD   06/29/2023 10:24 AM

## 2023-06-29 NOTE — Progress Notes (Signed)
 Patient is doing well, she had a PET scan done on 06/08/2023. She has no new questions or concerns for the doctor today, besides going over her PET scan results.

## 2023-07-02 ENCOUNTER — Telehealth: Payer: Self-pay | Admitting: *Deleted

## 2023-07-02 ENCOUNTER — Other Ambulatory Visit

## 2023-07-02 ENCOUNTER — Other Ambulatory Visit: Payer: Self-pay | Admitting: *Deleted

## 2023-07-02 DIAGNOSIS — K118 Other diseases of salivary glands: Secondary | ICD-10-CM

## 2023-07-02 NOTE — Telephone Encounter (Signed)
 I called the pt and told her that Alena Bills was going to show scan at tumor board and the conclusion was that she needs a biopsy of the right parotid gland. The  pt. Said that Merrill already told her about and she is ok to have it set up.

## 2023-07-02 NOTE — Telephone Encounter (Signed)
 I have attempted to reach patient to discuss plan of care. Per tumor board discussion, radiologist recommended performing a US guided biopsy of the Solitary hypermetabolic nodule along the right parotid gland (see on pet scan).  Order placed. Waiting on pt to call back.

## 2023-07-03 ENCOUNTER — Other Ambulatory Visit: Payer: Self-pay

## 2023-07-03 NOTE — Progress Notes (Signed)
 Natasha Banning, MD sent to [facility] PROCEDURE / BIOPSY REVIEW Date: 07/03/23  Requested Biopsy site: R parotid mass Reason for request: HM on PET Imaging review: Best seen on PET CT  Decision: Approved Imaging modality to perform: Ultrasound Schedule with: No sedation / Local anesthetic Schedule for: Any VIR  Additional comments: @Schedulers . [place] R parotid mass FNA  Please contact me with questions, concerns, or if issue pertaining to this request arise.  Natasha Banning, MD [facility]

## 2023-07-03 NOTE — Telephone Encounter (Signed)
 Pt called back yesterday- see separate phone note. Pt spoke with triage-agreeable to biopsy.

## 2023-07-06 ENCOUNTER — Telehealth: Payer: Self-pay

## 2023-07-06 NOTE — Telephone Encounter (Signed)
 Mon 4/7 at 1:30 an arrive at 1p. Per Marylu Lund - It will be with local anes so she does not need to be NPO nor a driver and she will come to the [facility] desk to check in.  Tiffany spoke with patient and confirmed w/pt that this apt time would work.

## 2023-07-06 NOTE — Telephone Encounter (Signed)
 Spoke with patient on the phone in regards to her Biopsy appointment. Paulla Fore our procedure scheduler, sent [place] this message, "I have Mon 4/7 at 1:30 an arrive at 1p. Let me know if this works. It will be with local anes so she does not need to be NPO nor a driver and she will come to the [facility] registration desk to check in". Patient said that was fine, she understood and agreed with this plan.

## 2023-07-10 NOTE — Progress Notes (Signed)
 Patient for US guided FNA RT parotid mass biopsy on Monday 07/13/23, I called and spoke with the patient on the phone and gave pre-procedure instructions. Pt was made aware to be here at 1p and check in at the [facility] registration desk. Pt stated understanding.  Called 07/10/23

## 2023-07-13 ENCOUNTER — Ambulatory Visit
Admission: RE | Admit: 2023-07-13 | Discharge: 2023-07-13 | Disposition: A | Discharge status: Home / Self Care | Source: Ambulatory Visit | Attending: Nurse Practitioner | Admitting: Nurse Practitioner

## 2023-07-13 DIAGNOSIS — K118 Other diseases of salivary glands: Secondary | ICD-10-CM | POA: Diagnosis present

## 2023-07-13 MED ORDER — LIDOCAINE HCL (PF) 1 % IJ SOLN
3.0000 mL | Freq: Once | INTRAMUSCULAR | Status: AC
  Administered 2023-07-13: 3 mL via INTRADERMAL
  Filled 2023-07-13: qty 4

## 2023-07-13 NOTE — Procedures (Signed)
 Interventional Radiology Procedure Note  Procedure: US guided FNA of RIGHT parotid nodule.  Complications: None  Estimated Blood Loss: None  Recommendations: - DC home   Signed,  Sterling Big, MD

## 2023-07-14 LAB — CYTOLOGY - NON PAP

## 2023-07-27 ENCOUNTER — Encounter: Payer: Self-pay | Admitting: Internal Medicine

## 2023-07-27 ENCOUNTER — Inpatient Hospital Stay: Attending: Internal Medicine | Admitting: Internal Medicine

## 2023-07-27 VITALS — BP 149/66 | HR 58 | Temp 96.8°F | Resp 16 | Wt 156.8 lb

## 2023-07-27 DIAGNOSIS — I129 Hypertensive chronic kidney disease with stage 1 through stage 4 chronic kidney disease, or unspecified chronic kidney disease: Secondary | ICD-10-CM | POA: Insufficient documentation

## 2023-07-27 DIAGNOSIS — Z803 Family history of malignant neoplasm of breast: Secondary | ICD-10-CM | POA: Insufficient documentation

## 2023-07-27 DIAGNOSIS — R778 Other specified abnormalities of plasma proteins: Secondary | ICD-10-CM

## 2023-07-27 DIAGNOSIS — R768 Other specified abnormal immunological findings in serum: Secondary | ICD-10-CM | POA: Insufficient documentation

## 2023-07-27 DIAGNOSIS — M8580 Other specified disorders of bone density and structure, unspecified site: Secondary | ICD-10-CM | POA: Insufficient documentation

## 2023-07-27 DIAGNOSIS — K118 Other diseases of salivary glands: Secondary | ICD-10-CM | POA: Insufficient documentation

## 2023-07-27 DIAGNOSIS — R590 Localized enlarged lymph nodes: Secondary | ICD-10-CM

## 2023-07-27 DIAGNOSIS — N183 Chronic kidney disease, stage 3 unspecified: Secondary | ICD-10-CM | POA: Insufficient documentation

## 2023-07-27 DIAGNOSIS — E785 Hyperlipidemia, unspecified: Secondary | ICD-10-CM | POA: Insufficient documentation

## 2023-07-27 NOTE — Progress Notes (Signed)
 [facility]  [phone] [phone]  ID: [name] OB: [birth year]  MR#: [id number]  QIO#:[id number]  Patient Care Team: Benuel Brazier, MD as PCP - General (Internal Medicine) Loreatha Rodney, MD as Consulting Physician (Oncology)  REFERRING PROVIDER: Dr. Alexa Andrews, MD  REASON FOR REFERRAL: MGUS  HPI: Natasha Murray is a 83 y.o. female with past medical history of CKD, hypertension, hyperlipidemia, GERD, CAD referred to hematology with concerns for MGUS.  Information obtained from the chart.   01/22/2022 -her case was reviewed by in-house nephrology.  ANCA negative, ANA positive, HIV/hep C/hep B negative.  SPEP/IFE elevated IgG and IgA level.  She was advised to avoid nephrotoxic drugs.  Renal ultrasound on 10/12/2021 showed left 5 mm and right 8 mm renal cyst otherwise normal.  Severe proteinuria.  Patient is on valsartan .  Skeletal survey from 06/09/2022 showed diffuse osteopenia.  Questionable subtle lucencies noted in the skull.  Although this could represent venous lakes tiny lytic lesions cannot be excluded.  Tiny lucency in the mid right radius cannot be completely excluded.  Diffuse multifocal degenerative change.  11/2022-SPEP with nephrology showed a faint band in gamma globulin region.  Immunofixation was not available. UPEP/IFE no M protein  11/17/2022- seen by hematology.  Repeat SPEP/IFE showed polyclonal increase in immunoglobulins.  05/18/23 - SPEP/IFE no M protein.  Polyclonal increase detected in immunoglobulins.  Kappa 152, lambda 60.5 with ratio 2.52 largely unchanged from 6 months ago.  Creatinine stable between 1.3-1.6.  CBC is unremarkable.  Patient did not have repeat skeletal surveys.  06/29/23 -PET scan was done to reevaluate the bony lesions. No hypermetabolic activity in the bone.  However showed incidental finding of abnormal lymph node involving the right parotid gland with SUV 16.2 measuring 15 x 9  mm.  Mild activity in the bilateral adrenal gland but no discrete masses, simple follow-up recommended in 74-month.   07/13/2023 -Case was discussed in tumor conference and biopsy of abnormal lymph node was recommended. S/p biopsy which was negative for malignancy.  Patient has known elevated total protein level at least since 2013.  Follows with Dr. Rhesa Celeste.  Patient has known proteinuria since 2017.  CKD is likely secondary to chronic hypertension.   Interval history Patient was seen today as follow-up accompanied by daughter. She has been feeling well overall.  Denies any recent infections vaccination use or cold.  REVIEW OF SYSTEMS:   ROS  As per HPI. Otherwise, a complete review of systems is negative.  PAST MEDICAL HISTORY: Past Medical History:  Diagnosis Date   CAD (coronary artery disease)    GERD (gastroesophageal reflux disease)    HLD (hyperlipidemia)    Hypertension    MI, old 15   Myocardial infarction (HCC) 10/14/2011    PAST SURGICAL HISTORY: Past Surgical History:  Procedure Laterality Date   ABDOMINAL HYSTERECTOMY     BREAST EXCISIONAL BIOPSY Left    surgical bx neg ? late 1990's   COLONOSCOPY WITH PROPOFOL  N/A 03/13/2016   Procedure: COLONOSCOPY WITH PROPOFOL ;  Surgeon: Marnee Sink, MD;  Location: [facility];  Service: Endoscopy;  Laterality: N/A;   CORONARY ARTERY BYPASS GRAFT  10/15/2011   5 vessel. [facility]   ESOPHAGOGASTRODUODENOSCOPY (EGD) WITH PROPOFOL  N/A 03/13/2016   Procedure: ESOPHAGOGASTRODUODENOSCOPY (EGD) WITH PROPOFOL ;  Surgeon: Marnee Sink, MD;  Location: [facility];  Service: Endoscopy;  Laterality: N/A;   POLYPECTOMY N/A 03/13/2016   Procedure: POLYPECTOMY;  Surgeon: Marnee Sink, MD;  Location: [facility];  Service: Endoscopy;  Laterality: N/A;    FAMILY HISTORY: Family History  Problem Relation Age of Onset   CAD Mother    Hypertension Mother    Peripheral vascular disease Father    Breast cancer Sister 60    Cancer Brother 53    HEALTH MAINTENANCE: Social History   Tobacco Use   Smoking status: Never   Smokeless tobacco: Never  Vaping Use   Vaping status: Never Used  Substance Use Topics   Alcohol use: Never   Drug use: Never     Allergies  Allergen Reactions   Codeine Nausea And Vomiting and Nausea Only    Current Outpatient Medications  Medication Sig Dispense Refill   allopurinol  (ZYLOPRIM ) 100 MG tablet Take 100 mg by mouth daily.     ALPRAZolam  (XANAX ) 0.25 MG tablet Take 1 tablet (0.25 mg total) by mouth at bedtime. 30 tablet 1   amLODipine  (NORVASC ) 5 MG tablet TAKE 1 TABLET BY MOUTH TWICE A DAY 180 tablet 1   aspirin EC 81 MG tablet Take 81 mg by mouth daily.     hydrochlorothiazide  (HYDRODIURIL ) 25 MG tablet TAKE 1 TABLET BY MOUTH EVERY DAY 90 tablet 3   lisinopril (ZESTRIL) 5 MG tablet Take 5 mg by mouth daily.     metoprolol tartrate (LOPRESSOR) 100 MG tablet TAKE 1 TABLET BY MOUTH TWICE A DAY 180 tablet 4   MITIGARE  0.6 MG CAPS TAKE 1 TABLET BY MOUTH 2 TIMES DAILY. (COVERS MITIGARE ) 60 capsule 6   nitroGLYCERIN  (NITROSTAT ) 0.4 MG SL tablet Place 1 tablet (0.4 mg total) under the tongue every 5 (five) minutes as needed for chest pain. 50 tablet 3   pantoprazole  (PROTONIX ) 20 MG tablet TAKE 1 TABLET BY MOUTH EVERY DAY 90 tablet 1   pravastatin  (PRAVACHOL ) 20 MG tablet Take by mouth.     valsartan  (DIOVAN ) 160 MG tablet Take 160 mg by mouth 2 (two) times daily.     busPIRone (BUSPAR) 7.5 MG tablet Take 7.5 mg by mouth 3 (three) times daily. (Patient not taking: Reported on 07/27/2023)     cholecalciferol (VITAMIN D3) 25 MCG (1000 UNIT) tablet Take 1,000 Units by mouth daily.     rosuvastatin  (CRESTOR ) 20 MG tablet Take 1 tablet (20 mg total) by mouth daily. (Patient not taking: Reported on 07/27/2023) 90 tablet 3   traMADol (ULTRAM) 50 MG tablet SMARTSIG:0.5 Tablet(s) By Mouth Every 12 Hours PRN (Patient not taking: Reported on 07/27/2023)     valsartan  (DIOVAN ) 80 MG tablet  Take 1 tablet (80 mg total) by mouth 2 (two) times daily. (Patient not taking: Reported on 06/01/2023) 180 tablet 2   No current facility-administered medications for this visit.    OBJECTIVE: Vitals:   07/27/23 1003  BP: (!) 149/66  Pulse: (!) 58  Resp: 16  Temp: (!) 96.8 F (36 C)     Body mass index is 32.77 kg/m.      General: Well-developed, well-nourished, no acute distress. Eyes: Pink conjunctiva, anicteric sclera. HEENT: Normocephalic, moist mucous membranes, clear oropharnyx. Lungs: Clear to auscultation bilaterally. Heart: Regular rate and rhythm. No rubs, murmurs, or gallops. Abdomen: Soft, nontender, nondistended. No organomegaly noted, normoactive bowel sounds. Musculoskeletal: No edema, cyanosis, or clubbing. Neuro: Alert, answering all questions appropriately. Cranial nerves grossly intact. Skin: No rashes or petechiae noted. Psych: Normal affect. Lymphatics: No cervical, calvicular, axillary or inguinal LAD.   LAB RESULTS:  Lab Results  Component Value Date   NA 131 (L) 05/18/2023   K 3.6  05/18/2023   CL 97 (L) 05/18/2023   CO2 25 05/18/2023   GLUCOSE 104 (H) 05/18/2023   BUN 31 (H) 05/18/2023   CREATININE 1.64 (H) 05/18/2023   CALCIUM  9.0 05/18/2023   PROT 9.1 (H) 05/18/2023   ALBUMIN 3.8 05/18/2023   AST 37 05/18/2023   ALT 31 05/18/2023   ALKPHOS 114 05/18/2023   BILITOT 0.6 05/18/2023   GFRNONAA 31 (L) 05/18/2023   GFRAA 38 (L) 11/24/2019    Lab Results  Component Value Date   WBC 4.3 05/18/2023   NEUTROABS 2.1 05/18/2023   HGB 12.3 05/18/2023   HCT 36.4 05/18/2023   MCV 94.3 05/18/2023   PLT 201 05/18/2023    No results found for: "TIBC", "FERRITIN", "IRONPCTSAT"   STUDIES: US  FNA SALIVARY GLAND/PAROTID GLAND Result Date: 07/14/2023 INDICATION: 83 year old female within FDG avid nodule in the right parotid gland noted incidentally on prior PET-CT. EXAM: Ultrasound-guided fine-needle aspiration biopsy of parotid nodule MEDICATIONS:  None. ANESTHESIA/SEDATION: None. COMPLICATIONS: None immediate. PROCEDURE: Informed written consent was obtained from the patient after a thorough discussion of the procedural risks, benefits and alternatives. All questions were addressed. Maximal Sterile Barrier Technique was utilized including caps, mask, sterile gowns, sterile gloves, sterile drape, hand hygiene and skin antiseptic. A timeout was performed prior to the initiation of the procedure. Ultrasound evaluation of the right parotid gland demonstrates a 1.8 x 1.0 by 0.9 cm hypoechoic soft tissue mass. A suitable skin entry site was selected and marked. The overlying skin was prepped and draped in the standard fashion with chlorhexidine skin prep. Local anesthesia was attained by infiltration with 1% lidocaine . Under real-time ultrasound guidance, multiple 25 gauge fine-needle aspiration biopsies were obtained. Biopsy specimens were given to pathology to create a cell block for further analysis. The patient tolerated the procedure well. IMPRESSION: Successful ultrasound-guided fine-needle aspiration biopsy of right parotid nodule. Electronically Signed   By: Fernando Hoyer M.D.   On: 07/14/2023 08:57    ASSESSMENT AND PLAN:   Natasha Murray is a 83 y.o. female with pmh of CKD, hypertension, hyperlipidemia, GERD, CAD referred to hematology with concerns for MGUS.  # CKD stage III # Elevated total protein -Patient was referred to hematology with concern for MGUS in Aug 2024. SPEP/IFE no M protein, polyclonal immunoglobulin increase detected, urine immunofixation no M protein.  Kappa 138, lambda 52 with a ratio 2.63.  On CBC hemoglobin normal 12.9, creatinine fluctuating between 1.3-1.6.  Has known proteinuria since 2017 and elevated t. Protein since 2013.  Follows with Dr. Rhesa Celeste.  On lab work, there is no evidence of monoclonal protein.  Suspicion for plasma cell dyscrasia is low.  Elevation in kappa lambda chain is likely secondary to CKD which  may be related to her chronic hypertension.  -She had skeletal survey from 06/09/2022 showed diffuse osteopenia.  Questionable subtle lucencies noted in the skull.  Although this could represent venous lakes tiny lytic lesions cannot be excluded.  Tiny lucency in the mid right radius cannot be completely excluded.  Diffuse multifocal degenerative change.    - PET scan was done to reevaluate the bony lesions. No hypermetabolic activity in the bone.  However showed incidental finding of abnormal lymph node involving the right parotid gland with SUV 16.2 measuring 15 x 9 mm.  Mild activity in the bilateral adrenal gland but no discrete masses, simple follow-up recommended in 44-month.    - Case was discussed in tumor conference and biopsy was recommended.  - s/p lymph node biopsy  on 07/13/2023.  Showed glandular cells and lymphocytes.  Negative for malignancy.  I will add the case again in the tumor conference to discuss if there is any need for follow-up imaging to assess the stability of the lymph node.  For now, I will not schedule any follow-ups since the workup so far has been negative and there has been no evidence of monoclonal protein on the lab work which has been repeated twice.  There is no concern for MGUS.  # Hypertension -On amlodipine , HCTZ, valsartan , metoprolol  # Hyperlipidemia-on Crestor   No orders of the defined types were placed in this encounter.  RTC as needed.  Patient expressed understanding and was in agreement with this plan. She also understands that She can call clinic at any time with any questions, concerns, or complaints.   I spent a total of 30 minutes reviewing chart data, face-to-face evaluation with the patient, counseling and coordination of care as detailed above.  Loreatha Rodney, MD   07/27/2023 12:43 PM

## 2023-07-30 ENCOUNTER — Other Ambulatory Visit

## 2023-09-28 ENCOUNTER — Other Ambulatory Visit: Payer: Self-pay | Admitting: Internal Medicine

## 2023-09-28 DIAGNOSIS — Z1231 Encounter for screening mammogram for malignant neoplasm of breast: Secondary | ICD-10-CM

## 2023-10-30 ENCOUNTER — Ambulatory Visit
Admission: RE | Admit: 2023-10-30 | Discharge: 2023-10-30 | Disposition: A | Discharge status: Home / Self Care | Source: Ambulatory Visit | Attending: Internal Medicine | Admitting: Internal Medicine

## 2023-10-30 DIAGNOSIS — Z1231 Encounter for screening mammogram for malignant neoplasm of breast: Secondary | ICD-10-CM | POA: Insufficient documentation

## 2024-04-17 ENCOUNTER — Other Ambulatory Visit: Payer: Self-pay

## 2024-04-17 ENCOUNTER — Encounter: Payer: Self-pay | Admitting: *Deleted

## 2024-04-17 ENCOUNTER — Emergency Department

## 2024-04-17 DIAGNOSIS — N183 Chronic kidney disease, stage 3 unspecified: Secondary | ICD-10-CM | POA: Diagnosis not present

## 2024-04-17 DIAGNOSIS — I129 Hypertensive chronic kidney disease with stage 1 through stage 4 chronic kidney disease, or unspecified chronic kidney disease: Secondary | ICD-10-CM | POA: Diagnosis not present

## 2024-04-17 DIAGNOSIS — S93401A Sprain of unspecified ligament of right ankle, initial encounter: Secondary | ICD-10-CM | POA: Insufficient documentation

## 2024-04-17 DIAGNOSIS — X501XXA Overexertion from prolonged static or awkward postures, initial encounter: Secondary | ICD-10-CM | POA: Insufficient documentation

## 2024-04-17 DIAGNOSIS — S92151A Displaced avulsion fracture (chip fracture) of right talus, initial encounter for closed fracture: Secondary | ICD-10-CM | POA: Insufficient documentation

## 2024-04-17 DIAGNOSIS — M25571 Pain in right ankle and joints of right foot: Secondary | ICD-10-CM | POA: Diagnosis present

## 2024-04-17 NOTE — ED Triage Notes (Signed)
 Pt to triage via wheelchair.  Pt states a pitbull knocked her down today.  Pt has right ankle pain  swelling noted.  Denies other injury.  Pt alert

## 2024-04-18 ENCOUNTER — Emergency Department
Admission: EM | Admit: 2024-04-18 | Discharge: 2024-04-18 | Disposition: A | Attending: Emergency Medicine | Admitting: Emergency Medicine

## 2024-04-18 DIAGNOSIS — S93401A Sprain of unspecified ligament of right ankle, initial encounter: Secondary | ICD-10-CM

## 2024-04-18 DIAGNOSIS — S92151A Displaced avulsion fracture (chip fracture) of right talus, initial encounter for closed fracture: Secondary | ICD-10-CM

## 2024-04-18 DIAGNOSIS — M25571 Pain in right ankle and joints of right foot: Secondary | ICD-10-CM

## 2024-04-18 NOTE — ED Notes (Signed)
 Pt here for fall. This RN placed fall bundle.

## 2024-04-18 NOTE — Discharge Instructions (Addendum)
 Use Tylenol  for pain and fevers.  Up to 1000 mg per dose, up to 4 times per day.  Do not take more than 4000 mg of Tylenol /acetaminophen  within 24 hours..  Use the boot to help with compression and swelling, stability and support.   Ice and elevation

## 2024-04-18 NOTE — ED Provider Notes (Signed)
 "  [facility] Provider Note    Event Date/Time   First MD Initiated Contact with Patient 04/18/24 0100     (approximate)   History   Ankle Pain   HPI  Natasha Murray is a 84 y.o. female who presents to the ED for evaluation of Ankle Pain   Review a nephrology clinic visit from September, CKD 3, MGUS, HTN, anxiety, no anticoagulation.  Patient presents with right ankle pain after she accidentally rolled her ankle this evening   Physical Exam   Triage Vital Signs: ED Triage Vitals  Encounter Vitals Group     BP 04/17/24 2221 (!) 148/52     Girls Systolic BP Percentile --      Girls Diastolic BP Percentile --      Boys Systolic BP Percentile --      Boys Diastolic BP Percentile --      Pulse Rate 04/17/24 2221 75     Resp 04/17/24 2221 18     Temp 04/17/24 2221 (!) 97.5 F (36.4 C)     Temp src --      SpO2 04/17/24 2221 100 %     Weight 04/17/24 2226 154 lb (69.9 kg)     Height 04/17/24 2226 4' 10 (1.473 m)     Head Circumference --      Peak Flow --      Pain Score 04/17/24 2226 9     Pain Loc --      Pain Education --      Exclude from Growth Chart --     Most recent vital signs: Vitals:   04/17/24 2221  BP: (!) 148/52  Pulse: 75  Resp: 18  Temp: (!) 97.5 F (36.4 C)  SpO2: 100%    General: Awake, no distress.  CV:  Good peripheral perfusion.  Resp:  Normal effort.  Abd:  No distention.  MSK:  No deformity noted.  Tenderness and pain to the anterior aspect of the right ankle, minimal soft tissue swelling noted.  No evidence of open injury. Palpation of all 4 extremities otherwise without evidence of deformity, tenderness or trauma. Neuro:  No focal deficits appreciated. Other:     ED Results / Procedures / Treatments   Labs (all labs ordered are listed, but only abnormal results are displayed) Labs Reviewed - No data to display  EKG   RADIOLOGY Plain film of the right ankle interpreted by me with tiny avulsed  fragment over the dorsal talus  Official radiology report(s): DG Ankle Complete Right Result Date: 04/17/2024 EXAM: 3 OR MORE VIEW(S) XRAY OF THE RIGHT ANKLE 04/17/2024 10:56:00 PM CLINICAL HISTORY: injury COMPARISON: None available. FINDINGS: BONES AND JOINTS: Suspected tiny dorsal talar avulsion on the lateral view. Small posterior and plantar calcaneal enthesophytes. No malalignment. SOFT TISSUES: Mild diffuse subcutaneous swelling along the ankle. IMPRESSION: 1. Suspected tiny dorsal talar avulsion fracture. Electronically signed by: Pinkie Pebbles MD MD 04/17/2024 11:01 PM EST RP Workstation: [id number]    PROCEDURES and INTERVENTIONS:  Procedures  Medications - No data to display   IMPRESSION / MDM / ASSESSMENT AND PLAN / ED COURSE  I reviewed the triage vital signs and the nursing notes.  Differential diagnosis includes, but is not limited to, ankle sprain, fracture, dislocation  Patient presents with right ankle pain after a fall.  Has a tiny avulsion off of the talus, likely a coexisting sprain.  Provide a walking boot discussed conservative therapies.  Suitable for outpatient management.  FINAL CLINICAL IMPRESSION(S) / ED DIAGNOSES   Final diagnoses:  Acute right ankle pain  Sprain of right ankle, unspecified ligament, initial encounter  Closed displaced avulsion fracture of right talus, initial encounter     Rx / DC Orders   ED Discharge Orders     None        Note:  This document was prepared using Dragon voice recognition software and may include unintentional dictation errors.   Claudene Rover, MD 04/18/24 0201  "
